# Patient Record
Sex: Female | Born: 1960 | Race: White | Hispanic: No | Smoking: Current every day smoker
Health system: Southern US, Community
[De-identification: ages and names within clinical notes are randomized; demographics above are authoritative.]

## PROBLEM LIST (undated history)

## (undated) DIAGNOSIS — Z9889 Other specified postprocedural states: Secondary | ICD-10-CM

## (undated) DIAGNOSIS — R112 Nausea with vomiting, unspecified: Secondary | ICD-10-CM

## (undated) HISTORY — PX: OTHER SURGICAL HISTORY: SHX169

## (undated) HISTORY — PX: TUBAL LIGATION: SHX77

---

## 2001-05-12 ENCOUNTER — Emergency Department (HOSPITAL_COMMUNITY): Admission: EM | Admit: 2001-05-12 | Discharge: 2001-05-13 | Payer: Self-pay

## 2001-10-18 ENCOUNTER — Encounter: Payer: Self-pay | Admitting: Emergency Medicine

## 2001-10-18 ENCOUNTER — Emergency Department (HOSPITAL_COMMUNITY): Admission: EM | Admit: 2001-10-18 | Discharge: 2001-10-18 | Payer: Self-pay | Admitting: Emergency Medicine

## 2002-01-28 ENCOUNTER — Encounter: Admission: RE | Admit: 2002-01-28 | Discharge: 2002-01-28 | Payer: Self-pay | Admitting: Family Medicine

## 2002-07-23 ENCOUNTER — Emergency Department (HOSPITAL_COMMUNITY): Admission: EM | Admit: 2002-07-23 | Discharge: 2002-07-23 | Payer: Self-pay | Admitting: Emergency Medicine

## 2002-07-23 ENCOUNTER — Encounter: Payer: Self-pay | Admitting: Emergency Medicine

## 2002-09-05 ENCOUNTER — Emergency Department (HOSPITAL_COMMUNITY): Admission: EM | Admit: 2002-09-05 | Discharge: 2002-09-05 | Payer: Self-pay | Admitting: Emergency Medicine

## 2002-09-09 ENCOUNTER — Encounter: Payer: Self-pay | Admitting: Emergency Medicine

## 2002-09-09 ENCOUNTER — Emergency Department (HOSPITAL_COMMUNITY): Admission: AD | Admit: 2002-09-09 | Discharge: 2002-09-10 | Payer: Self-pay | Admitting: Emergency Medicine

## 2002-12-07 ENCOUNTER — Emergency Department (HOSPITAL_COMMUNITY): Admission: EM | Admit: 2002-12-07 | Discharge: 2002-12-07 | Payer: Self-pay | Admitting: Emergency Medicine

## 2003-01-11 ENCOUNTER — Emergency Department (HOSPITAL_COMMUNITY): Admission: EM | Admit: 2003-01-11 | Discharge: 2003-01-11 | Payer: Self-pay | Admitting: Emergency Medicine

## 2003-01-15 ENCOUNTER — Emergency Department (HOSPITAL_COMMUNITY): Admission: EM | Admit: 2003-01-15 | Discharge: 2003-01-15 | Payer: Self-pay | Admitting: Emergency Medicine

## 2003-01-29 ENCOUNTER — Emergency Department (HOSPITAL_COMMUNITY): Admission: AD | Admit: 2003-01-29 | Discharge: 2003-01-29 | Payer: Self-pay | Admitting: Family Medicine

## 2003-02-10 ENCOUNTER — Emergency Department (HOSPITAL_COMMUNITY): Admission: AD | Admit: 2003-02-10 | Discharge: 2003-02-10 | Payer: Self-pay | Admitting: Family Medicine

## 2003-03-11 ENCOUNTER — Emergency Department (HOSPITAL_COMMUNITY): Admission: AD | Admit: 2003-03-11 | Discharge: 2003-03-11 | Payer: Self-pay

## 2003-03-30 ENCOUNTER — Emergency Department (HOSPITAL_COMMUNITY): Admission: EM | Admit: 2003-03-30 | Discharge: 2003-03-30 | Payer: Self-pay | Admitting: Internal Medicine

## 2003-04-02 ENCOUNTER — Emergency Department (HOSPITAL_COMMUNITY): Admission: EM | Admit: 2003-04-02 | Discharge: 2003-04-02 | Payer: Self-pay | Admitting: Physical Therapy

## 2003-04-29 ENCOUNTER — Encounter: Admission: RE | Admit: 2003-04-29 | Discharge: 2003-04-29 | Payer: Self-pay | Admitting: Family Medicine

## 2003-04-29 ENCOUNTER — Other Ambulatory Visit: Admission: RE | Admit: 2003-04-29 | Discharge: 2003-04-29 | Payer: Self-pay | Admitting: Family Medicine

## 2003-07-18 ENCOUNTER — Emergency Department (HOSPITAL_COMMUNITY): Admission: EM | Admit: 2003-07-18 | Discharge: 2003-07-18 | Payer: Self-pay | Admitting: Family Medicine

## 2003-09-13 ENCOUNTER — Emergency Department (HOSPITAL_COMMUNITY): Admission: EM | Admit: 2003-09-13 | Discharge: 2003-09-13 | Payer: Self-pay | Admitting: Emergency Medicine

## 2003-09-14 ENCOUNTER — Emergency Department (HOSPITAL_COMMUNITY): Admission: EM | Admit: 2003-09-14 | Discharge: 2003-09-14 | Payer: Self-pay | Admitting: Emergency Medicine

## 2003-10-02 ENCOUNTER — Encounter: Admission: RE | Admit: 2003-10-02 | Discharge: 2003-10-02 | Payer: Self-pay | Admitting: Family Medicine

## 2003-10-31 ENCOUNTER — Emergency Department (HOSPITAL_COMMUNITY): Admission: EM | Admit: 2003-10-31 | Discharge: 2003-10-31 | Payer: Self-pay | Admitting: Family Medicine

## 2004-06-01 ENCOUNTER — Emergency Department (HOSPITAL_COMMUNITY): Admission: EM | Admit: 2004-06-01 | Discharge: 2004-06-01 | Payer: Self-pay | Admitting: Emergency Medicine

## 2004-08-10 ENCOUNTER — Emergency Department (HOSPITAL_COMMUNITY): Admission: EM | Admit: 2004-08-10 | Discharge: 2004-08-10 | Payer: Self-pay | Admitting: Emergency Medicine

## 2006-02-03 ENCOUNTER — Emergency Department (HOSPITAL_COMMUNITY): Admission: EM | Admit: 2006-02-03 | Discharge: 2006-02-03 | Payer: Self-pay | Admitting: Emergency Medicine

## 2006-07-16 ENCOUNTER — Emergency Department (HOSPITAL_COMMUNITY): Admission: EM | Admit: 2006-07-16 | Discharge: 2006-07-16 | Payer: Self-pay | Admitting: Emergency Medicine

## 2006-08-06 ENCOUNTER — Emergency Department (HOSPITAL_COMMUNITY): Admission: EM | Admit: 2006-08-06 | Discharge: 2006-08-06 | Payer: Self-pay | Admitting: Emergency Medicine

## 2006-11-27 ENCOUNTER — Emergency Department (HOSPITAL_COMMUNITY): Admission: EM | Admit: 2006-11-27 | Discharge: 2006-11-27 | Payer: Self-pay | Admitting: Emergency Medicine

## 2010-11-03 ENCOUNTER — Emergency Department (HOSPITAL_COMMUNITY)
Admission: EM | Admit: 2010-11-03 | Discharge: 2010-11-03 | Disposition: A | Discharge status: Home / Self Care | Payer: Self-pay | Attending: Emergency Medicine | Admitting: Emergency Medicine

## 2010-11-03 DIAGNOSIS — E876 Hypokalemia: Secondary | ICD-10-CM | POA: Insufficient documentation

## 2010-11-03 DIAGNOSIS — R071 Chest pain on breathing: Secondary | ICD-10-CM | POA: Insufficient documentation

## 2010-11-03 DIAGNOSIS — M25569 Pain in unspecified knee: Secondary | ICD-10-CM | POA: Insufficient documentation

## 2010-11-03 LAB — CBC
HCT: 31.8 % — ABNORMAL LOW (ref 36.0–46.0)
Hemoglobin: 10.9 g/dL — ABNORMAL LOW (ref 12.0–15.0)
MCH: 28.5 pg (ref 26.0–34.0)
MCHC: 34.3 g/dL (ref 30.0–36.0)
MCV: 83 fL (ref 78.0–100.0)
Platelets: 287 10*3/uL (ref 150–400)
RBC: 3.83 MIL/uL — ABNORMAL LOW (ref 3.87–5.11)
RDW: 14.5 % (ref 11.5–15.5)
WBC: 10.2 10*3/uL (ref 4.0–10.5)

## 2010-11-03 LAB — POCT I-STAT TROPONIN I: Troponin i, poc: 0 ng/mL (ref 0.00–0.08)

## 2010-11-03 LAB — COMPREHENSIVE METABOLIC PANEL
ALT: 6 U/L (ref 0–35)
AST: 17 U/L (ref 0–37)
Albumin: 3.5 g/dL (ref 3.5–5.2)
Alkaline Phosphatase: 64 U/L (ref 39–117)
BUN: 10 mg/dL (ref 6–23)
CO2: 23 mEq/L (ref 19–32)
Calcium: 8.8 mg/dL (ref 8.4–10.5)
Chloride: 105 mEq/L (ref 96–112)
Creatinine, Ser: 0.85 mg/dL (ref 0.50–1.10)
GFR calc Af Amer: >60 mL/min (ref >60)
GFR calc non Af Amer: >60 mL/min (ref >60)
Glucose, Bld: 99 mg/dL (ref 70–99)
Potassium: 3 mEq/L — ABNORMAL LOW (ref 3.5–5.1)
Sodium: 139 mEq/L (ref 135–145)
Total Bilirubin: 0.3 mg/dL (ref 0.3–1.2)
Total Protein: 7.1 g/dL (ref 6.0–8.3)

## 2010-12-16 LAB — URINALYSIS, ROUTINE W REFLEX MICROSCOPIC
Glucose, UA: NEGATIVE
Hgb urine dipstick: NEGATIVE
Ketones, ur: 15 — AB
Nitrite: NEGATIVE
Protein, ur: NEGATIVE
Specific Gravity, Urine: 1.027
Urobilinogen, UA: 1
pH: 5.5

## 2010-12-16 LAB — COMPREHENSIVE METABOLIC PANEL
ALT: 10
AST: 27
Albumin: 3.7
Alkaline Phosphatase: 60
BUN: 13
CO2: 21
Calcium: 8.3 — ABNORMAL LOW
Chloride: 110
Creatinine, Ser: 0.78
GFR calc Af Amer: >60
GFR calc non Af Amer: >60
Glucose, Bld: 107 — ABNORMAL HIGH
Potassium: 3.2 — ABNORMAL LOW
Sodium: 137
Total Bilirubin: 1.3 — ABNORMAL HIGH
Total Protein: 6.9

## 2010-12-16 LAB — DIFFERENTIAL
Basophils Absolute: 0
Basophils Relative: 0
Eosinophils Absolute: 0.4
Eosinophils Relative: 4
Lymphocytes Relative: 11 — ABNORMAL LOW
Lymphs Abs: 0.9
Monocytes Absolute: 0.2
Monocytes Relative: 2 — ABNORMAL LOW
Neutro Abs: 7
Neutrophils Relative %: 82 — ABNORMAL HIGH

## 2010-12-16 LAB — CK TOTAL AND CKMB (NOT AT ARMC)
CK, MB: 1.5
Relative Index: INVALID
Total CK: 90

## 2010-12-16 LAB — LIPASE, BLOOD: Lipase: 32

## 2010-12-16 LAB — CBC
HCT: 33.7 — ABNORMAL LOW
Hemoglobin: 11.6 — ABNORMAL LOW
MCHC: 34.3
MCV: 84.4
Platelets: 268
RBC: 3.99
RDW: 14.1 — ABNORMAL HIGH
WBC: 8.5

## 2010-12-16 LAB — TROPONIN I: Troponin I: 0.01

## 2010-12-16 LAB — D-DIMER, QUANTITATIVE: D-Dimer, Quant: 0.4

## 2011-01-21 ENCOUNTER — Emergency Department (HOSPITAL_COMMUNITY)
Admission: EM | Admit: 2011-01-21 | Discharge: 2011-01-21 | Disposition: A | Discharge status: Home / Self Care | Payer: Self-pay | Attending: Emergency Medicine | Admitting: Emergency Medicine

## 2011-01-21 ENCOUNTER — Encounter: Payer: Self-pay | Admitting: *Deleted

## 2011-01-21 DIAGNOSIS — R10819 Abdominal tenderness, unspecified site: Secondary | ICD-10-CM | POA: Insufficient documentation

## 2011-01-21 DIAGNOSIS — R112 Nausea with vomiting, unspecified: Secondary | ICD-10-CM | POA: Insufficient documentation

## 2011-01-21 DIAGNOSIS — R509 Fever, unspecified: Secondary | ICD-10-CM | POA: Insufficient documentation

## 2011-01-21 DIAGNOSIS — J45909 Unspecified asthma, uncomplicated: Secondary | ICD-10-CM | POA: Insufficient documentation

## 2011-01-21 DIAGNOSIS — K2990 Gastroduodenitis, unspecified, without bleeding: Secondary | ICD-10-CM | POA: Insufficient documentation

## 2011-01-21 DIAGNOSIS — R350 Frequency of micturition: Secondary | ICD-10-CM | POA: Insufficient documentation

## 2011-01-21 DIAGNOSIS — R109 Unspecified abdominal pain: Secondary | ICD-10-CM | POA: Insufficient documentation

## 2011-01-21 DIAGNOSIS — K2970 Gastritis, unspecified, without bleeding: Secondary | ICD-10-CM | POA: Insufficient documentation

## 2011-01-21 DIAGNOSIS — N390 Urinary tract infection, site not specified: Secondary | ICD-10-CM | POA: Insufficient documentation

## 2011-01-21 LAB — URINALYSIS, ROUTINE W REFLEX MICROSCOPIC
Glucose, UA: NEGATIVE mg/dL
Ketones, ur: 40 mg/dL — AB
Nitrite: POSITIVE — AB
Protein, ur: NEGATIVE mg/dL
Specific Gravity, Urine: 1.028 (ref 1.005–1.030)
Urobilinogen, UA: 2 mg/dL — ABNORMAL HIGH (ref 0.0–1.0)
pH: 5.5 (ref 5.0–8.0)

## 2011-01-21 LAB — POCT PREGNANCY, URINE: Preg Test, Ur: NEGATIVE

## 2011-01-21 LAB — URINE MICROSCOPIC-ADD ON

## 2011-01-21 MED ORDER — GI COCKTAIL ~~LOC~~
30.0000 mL | Freq: Once | ORAL | Status: AC
  Administered 2011-01-21: 30 mL via ORAL
  Filled 2011-01-21: qty 30

## 2011-01-21 MED ORDER — CEPHALEXIN 250 MG PO CAPS
500.0000 mg | ORAL_CAPSULE | Freq: Once | ORAL | Status: AC
  Administered 2011-01-21: 500 mg via ORAL
  Filled 2011-01-21: qty 2

## 2011-01-21 MED ORDER — CEPHALEXIN 500 MG PO CAPS: 500.0000 mg | ORAL_CAPSULE | Freq: Four times a day (QID) | ORAL | Status: AC

## 2011-01-21 NOTE — ED Notes (Signed)
MD at bedside. 

## 2011-01-21 NOTE — ED Notes (Signed)
Lower abd pain and dark urine since Wednesday.  Some nv and diarrhea

## 2011-01-21 NOTE — ED Provider Notes (Signed)
History     CSN: [id number] Arrival date & time: 01/21/2011  4:36 PM   First MD Initiated Contact with Patient 01/21/11 2004      Chief Complaint  Patient presents with  . Abdominal Pain    (Consider location/radiation/quality/duration/timing/severity/associated sxs/prior treatment) Patient is a 50 y.o. female presenting with abdominal pain. The history is provided by the patient.  Abdominal Pain The primary symptoms of the illness include abdominal pain, fever, nausea and vomiting. The primary symptoms of the illness do not include shortness of breath, diarrhea, dysuria, vaginal discharge or vaginal bleeding. The current episode started 2 days ago. The onset of the illness was gradual.  The abdominal pain began 2 days ago. The abdominal pain is located in the suprapubic region. The abdominal pain does not radiate. The severity of the abdominal pain is 6/10. The abdominal pain is relieved by nothing.  The fever began yesterday. The maximum temperature recorded prior to her arrival was unknown (subjective).  Vomiting occurs 2 to 5 times per day. The emesis contains stomach contents.  The patient states that she believes she is currently not pregnant. The patient has not had a change in bowel habit. Additional symptoms associated with the illness include frequency. Symptoms associated with the illness do not include chills, urgency, hematuria or back pain.    Past Medical History  Diagnosis Date  . Asthma     History reviewed. No pertinent past surgical history.  History reviewed. No pertinent family history.  History  Substance Use Topics  . Smoking status: Never Smoker   . Smokeless tobacco: Not on file  . Alcohol Use: No    OB History    Grav Para Term Preterm Abortions TAB SAB Ect Mult Living                  Review of Systems  Constitutional: Positive for fever. Negative for chills.  Respiratory: Negative for cough and shortness of breath.   Cardiovascular:  Negative for chest pain and palpitations.  Gastrointestinal: Positive for nausea, vomiting and abdominal pain. Negative for diarrhea.  Genitourinary: Positive for frequency. Negative for dysuria, urgency, hematuria, vaginal bleeding and vaginal discharge.       Abnormal odor recently  Musculoskeletal: Negative for back pain.  Neurological: Negative for light-headedness and headaches.  All other systems reviewed and are negative.    Allergies  Review of patient's allergies indicates no known allergies.  Home Medications   Current Outpatient Rx  Name Route Sig Dispense Refill  . ACETAMINOPHEN 500 MG PO TABS Oral Take 500-1,000 mg by mouth every 6 (six) hours as needed. For headache/pain       BP 107/72  Pulse 77  Temp(Src) 97.5 F (36.4 C) (Oral)  Resp 22  SpO2 100%  LMP 12/21/2010  Physical Exam  Nursing note and vitals reviewed. Constitutional: She is oriented to person, place, and time. She appears well-developed and well-nourished.  HENT:  Head: Normocephalic and atraumatic.  Eyes: Pupils are equal, round, and reactive to light.  Cardiovascular: Normal rate and regular rhythm.   Pulmonary/Chest: Effort normal and breath sounds normal. No respiratory distress.  Abdominal: Soft. Bowel sounds are normal. She exhibits no distension. There is tenderness (mild suprapubic, epigastric).  Neurological: She is alert and oriented to person, place, and time.  Skin: Skin is warm and dry.  Psychiatric: She has a normal mood and affect.    ED Course  Procedures (including critical care time)  Labs Reviewed  URINALYSIS, ROUTINE W REFLEX  MICROSCOPIC - Abnormal; Notable for the following:    Color, Urine AMBER (*) BIOCHEMICALS MAY BE AFFECTED BY COLOR   Appearance TURBID (*)    Hgb urine dipstick SMALL (*)    Bilirubin Urine MODERATE (*)    Ketones, ur 40 (*)    Urobilinogen, UA 2.0 (*)    Nitrite POSITIVE (*)    Leukocytes, UA LARGE (*)    All other components within normal  limits  URINE MICROSCOPIC-ADD ON - Abnormal; Notable for the following:    Squamous Epithelial / LPF MANY (*)    Bacteria, UA MANY (*)    All other components within normal limits  POCT PREGNANCY, URINE  POCT PREGNANCY, URINE   No results found.   1. UTI (urinary tract infection)   2. Gastritis       MDM  50 yo F presents with 2 days suprapubic abd pain, with associated N/V. Endorses mild frequency, foul odor earlier this week, subjective fever yesterday, but denies dysuria, back pain, other urinary or GU complaints. Says has thrown up ~6-7 times, and that as of ~1 hour ago, began having mild epigastric burning. Exam remarkable for mild suprapubic ttp, as well as epigastric ttp, no other ttp, rebound/guarding. Biggest concern is for urinary related etiology of sxs; UA obtained shows signs of UTI. Will treat with first dose atbx here, as well as for epigastric burning. Discussed with pt expectation of improvement, need to take medication, indications for return, and pt expresses understanding.         Theotis Burrow, MD 01/22/11 [phone]

## 2011-01-22 NOTE — ED Provider Notes (Signed)
I saw and evaluated the patient, reviewed the resident's note and I agree with the findings and plan.  Nicholes Stairs, MD 01/22/11 [phone]

## 2011-03-08 ENCOUNTER — Inpatient Hospital Stay (HOSPITAL_COMMUNITY)
Admission: EM | Admit: 2011-03-08 | Discharge: 2011-03-11 | DRG: 418 | Disposition: A | Discharge status: Home / Self Care | Payer: Self-pay | Attending: General Surgery | Admitting: General Surgery

## 2011-03-08 ENCOUNTER — Encounter (HOSPITAL_COMMUNITY): Payer: Self-pay | Admitting: Emergency Medicine

## 2011-03-08 DIAGNOSIS — K810 Acute cholecystitis: Principal | ICD-10-CM | POA: Diagnosis present

## 2011-03-08 DIAGNOSIS — R109 Unspecified abdominal pain: Secondary | ICD-10-CM | POA: Diagnosis present

## 2011-03-08 DIAGNOSIS — N390 Urinary tract infection, site not specified: Secondary | ICD-10-CM | POA: Diagnosis present

## 2011-03-08 DIAGNOSIS — K819 Cholecystitis, unspecified: Secondary | ICD-10-CM | POA: Diagnosis present

## 2011-03-08 DIAGNOSIS — F17200 Nicotine dependence, unspecified, uncomplicated: Secondary | ICD-10-CM | POA: Diagnosis present

## 2011-03-08 DIAGNOSIS — R112 Nausea with vomiting, unspecified: Secondary | ICD-10-CM | POA: Diagnosis present

## 2011-03-08 DIAGNOSIS — J45909 Unspecified asthma, uncomplicated: Secondary | ICD-10-CM | POA: Diagnosis present

## 2011-03-08 DIAGNOSIS — K859 Acute pancreatitis without necrosis or infection, unspecified: Secondary | ICD-10-CM

## 2011-03-08 DIAGNOSIS — D72829 Elevated white blood cell count, unspecified: Secondary | ICD-10-CM | POA: Diagnosis present

## 2011-03-08 DIAGNOSIS — E669 Obesity, unspecified: Secondary | ICD-10-CM | POA: Diagnosis present

## 2011-03-08 HISTORY — DX: Nausea with vomiting, unspecified: Z98.890

## 2011-03-08 HISTORY — DX: Nausea with vomiting, unspecified: R11.2

## 2011-03-08 LAB — DIFFERENTIAL
Basophils Absolute: 0.1 10*3/uL (ref 0.0–0.1)
Basophils Relative: 0 % (ref 0–1)
Eosinophils Absolute: 0.4 10*3/uL (ref 0.0–0.7)
Eosinophils Relative: 2 % (ref 0–5)
Lymphocytes Relative: 17 % (ref 12–46)
Lymphs Abs: 2.8 10*3/uL (ref 0.7–4.0)
Monocytes Absolute: 0.7 10*3/uL (ref 0.1–1.0)
Monocytes Relative: 4 % (ref 3–12)
Neutro Abs: 12.9 10*3/uL — ABNORMAL HIGH (ref 1.7–7.7)
Neutrophils Relative %: 77 % (ref 43–77)

## 2011-03-08 LAB — CBC
HCT: 33.7 % — ABNORMAL LOW (ref 36.0–46.0)
Hemoglobin: 11.3 g/dL — ABNORMAL LOW (ref 12.0–15.0)
MCH: 28 pg (ref 26.0–34.0)
MCHC: 33.5 g/dL (ref 30.0–36.0)
MCV: 83.6 fL (ref 78.0–100.0)
Platelets: 407 10*3/uL — ABNORMAL HIGH (ref 150–400)
RBC: 4.03 MIL/uL (ref 3.87–5.11)
RDW: 13.4 % (ref 11.5–15.5)
WBC: 16.9 10*3/uL — ABNORMAL HIGH (ref 4.0–10.5)

## 2011-03-08 LAB — COMPREHENSIVE METABOLIC PANEL
ALT: 19 U/L (ref 0–35)
AST: 48 U/L — ABNORMAL HIGH (ref 0–37)
Albumin: 4 g/dL (ref 3.5–5.2)
Alkaline Phosphatase: 113 U/L (ref 39–117)
BUN: 14 mg/dL (ref 6–23)
CO2: 21 mEq/L (ref 19–32)
Calcium: 9.1 mg/dL (ref 8.4–10.5)
Chloride: 103 mEq/L (ref 96–112)
Creatinine, Ser: 0.9 mg/dL (ref 0.50–1.10)
GFR calc Af Amer: 86 mL/min — ABNORMAL LOW (ref >90)
GFR calc non Af Amer: 74 mL/min — ABNORMAL LOW (ref >90)
Glucose, Bld: 115 mg/dL — ABNORMAL HIGH (ref 70–99)
Potassium: 3.6 mEq/L (ref 3.5–5.1)
Sodium: 138 mEq/L (ref 135–145)
Total Bilirubin: 0.5 mg/dL (ref 0.3–1.2)
Total Protein: 7.8 g/dL (ref 6.0–8.3)

## 2011-03-08 NOTE — ED Notes (Signed)
PT. REPORTS VOMITTING WITH RIGHT ABDOMINAL PAIN ONSET THIS EVENING , DENIES DIARRHEA OR FEVER .

## 2011-03-09 ENCOUNTER — Emergency Department (HOSPITAL_COMMUNITY): Payer: Self-pay

## 2011-03-09 ENCOUNTER — Inpatient Hospital Stay (HOSPITAL_COMMUNITY): Payer: Self-pay

## 2011-03-09 ENCOUNTER — Encounter (HOSPITAL_COMMUNITY): Payer: Self-pay | Admitting: *Deleted

## 2011-03-09 DIAGNOSIS — R109 Unspecified abdominal pain: Secondary | ICD-10-CM | POA: Diagnosis present

## 2011-03-09 DIAGNOSIS — D72829 Elevated white blood cell count, unspecified: Secondary | ICD-10-CM | POA: Diagnosis present

## 2011-03-09 DIAGNOSIS — J45909 Unspecified asthma, uncomplicated: Secondary | ICD-10-CM | POA: Diagnosis present

## 2011-03-09 DIAGNOSIS — K8010 Calculus of gallbladder with chronic cholecystitis without obstruction: Secondary | ICD-10-CM

## 2011-03-09 DIAGNOSIS — E669 Obesity, unspecified: Secondary | ICD-10-CM | POA: Diagnosis present

## 2011-03-09 DIAGNOSIS — K819 Cholecystitis, unspecified: Secondary | ICD-10-CM | POA: Diagnosis present

## 2011-03-09 LAB — URINALYSIS, ROUTINE W REFLEX MICROSCOPIC
Glucose, UA: NEGATIVE mg/dL
Hgb urine dipstick: NEGATIVE
Ketones, ur: 15 mg/dL — AB
Nitrite: NEGATIVE
Protein, ur: NEGATIVE mg/dL
Specific Gravity, Urine: 1.024 (ref 1.005–1.030)
Urobilinogen, UA: 1 mg/dL (ref 0.0–1.0)
pH: 5.5 (ref 5.0–8.0)

## 2011-03-09 LAB — URINE MICROSCOPIC-ADD ON

## 2011-03-09 LAB — URINE CULTURE
Colony Count: NO GROWTH
Culture  Setup Time: 201301090700
Culture: NO GROWTH

## 2011-03-09 LAB — LIPASE, BLOOD: Lipase: 36 U/L (ref 11–59)

## 2011-03-09 MED ORDER — LORAZEPAM 2 MG/ML IJ SOLN
0.5000 mg | Freq: Once | INTRAMUSCULAR | Status: AC
  Administered 2011-03-09: 0.5 mg via INTRAVENOUS
  Filled 2011-03-09: qty 1

## 2011-03-09 MED ORDER — DEXTROSE 5 % IV SOLN
1.0000 g | INTRAVENOUS | Status: DC
  Administered 2011-03-09 – 2011-03-10 (×2): 1 g via INTRAVENOUS
  Filled 2011-03-09 (×2): qty 10

## 2011-03-09 MED ORDER — MORPHINE SULFATE 2 MG/ML IJ SOLN
2.0000 mg | INTRAMUSCULAR | Status: DC | PRN
  Administered 2011-03-09 (×2): 2 mg via INTRAVENOUS
  Filled 2011-03-09 (×2): qty 1

## 2011-03-09 MED ORDER — PROMETHAZINE HCL 25 MG PO TABS: 12.5000 mg | ORAL_TABLET | Freq: Four times a day (QID) | ORAL | Status: DC | PRN

## 2011-03-09 MED ORDER — PROMETHAZINE HCL 25 MG/ML IJ SOLN: 12.5000 mg | Freq: Four times a day (QID) | INTRAMUSCULAR | Status: DC | PRN

## 2011-03-09 MED ORDER — IOHEXOL 300 MG/ML  SOLN
100.0000 mL | Freq: Once | INTRAMUSCULAR | Status: AC | PRN
  Administered 2011-03-09: 100 mL via INTRAVENOUS

## 2011-03-09 MED ORDER — ONDANSETRON HCL 4 MG/2ML IJ SOLN
4.0000 mg | Freq: Once | INTRAMUSCULAR | Status: AC
  Administered 2011-03-09: 4 mg via INTRAVENOUS
  Filled 2011-03-09: qty 2

## 2011-03-09 MED ORDER — ALUM & MAG HYDROXIDE-SIMETH 200-200-20 MG/5ML PO SUSP: 30.0000 mL | Freq: Four times a day (QID) | ORAL | Status: DC | PRN

## 2011-03-09 MED ORDER — IOHEXOL 300 MG/ML  SOLN: 20.0000 mL | INTRAMUSCULAR | Status: DC

## 2011-03-09 MED ORDER — DEXTROSE 5 % IV SOLN
1.0000 g | Freq: Once | INTRAVENOUS | Status: AC
  Administered 2011-03-09: 1 g via INTRAVENOUS
  Filled 2011-03-09: qty 10

## 2011-03-09 MED ORDER — MORPHINE SULFATE 4 MG/ML IJ SOLN
4.0000 mg | Freq: Once | INTRAMUSCULAR | Status: AC
  Administered 2011-03-09: 4 mg via INTRAVENOUS
  Filled 2011-03-09: qty 1

## 2011-03-09 MED ORDER — ONDANSETRON HCL 4 MG/2ML IJ SOLN
INTRAMUSCULAR | Status: AC
  Administered 2011-03-09: 02:00:00
  Filled 2011-03-09: qty 2

## 2011-03-09 NOTE — ED Provider Notes (Signed)
Medical screening examination/treatment/procedure(s) were conducted as a shared visit with non-physician practitioner(s) and myself.  I personally evaluated the patient during the encounter  CT scan with evidence of acute pancreatitis and pancreatic phlegmon however her lipase is normal.  The patient's symptoms and her continued nausea and vomiting will omit the patient for further evaluation  Lyanne Co, MD 03/09/11 [phone]

## 2011-03-09 NOTE — H&P (Signed)
Angelica Frank is an 51 y.o. female.   CC- Abdominal pain and N/V x 2 days HPI-Had real bad pains in her r side and started throwing up.  This all started about 03/07/11.  Got worse 03/08/11 evening.  Started as real bad pain on the R side.  The pain was sharp.  Was coming and going type initally and then became constant.  At it's worst it was a 9/10.  Morphine seemed to have relieved it.  Has had this pain last month which was almost like this and thoguht to have a UTI.   Nauseaous when she eats and drinks.  Has a h/o cholelithiasis-had an USg of the liver which showed didn't GB stones maybe 10-15 yrs ago.Usually does get UTI's but nothing like last month's or this one's. No ithcing of skin or yellowing of eyes.   Laying down caused more pain and stooping over relieved the pain.  NO dark stool or tarry stool.  Monday night had watery diarrhea x 2 events.  No burning in throught, no diffculty swallowing.  No PMH of food-vomit relationship.  Denies Dysuria, no frequency-lost control of bladder with vomit, which is not new.  Pain in the suprapubic area and then went up to the mid-abdomen.   Doesn;t report any fever or chills.  Threw up maybe about 2-3 x at home and twice here.   No assosc CP, no SOB, no blurred or double vision.  When she gets the pain she vomits after.     Past Medical History  Diagnosis Date  . Asthma     diagnosed in her teens-no meds  . PONV (postoperative nausea and vomiting)     Past Surgical History  Procedure Date  . Tuibal ligation     Had BTL  21 yes ago  . Tubal ligation     Family History  Problem Relation Age of Onset  . Arthritis Mother   . Coronary artery disease Mother   . Hypertension Father   . Diabetes Father   . Coronary artery disease Father   . Arrhythmia Maternal Grandmother   . Cancer Maternal Grandfather    Social History:  reports that she has been smoking Cigarettes.  She has been smoking about .25 packs per day. She does not have any smokeless  tobacco history on file. She reports that she drinks alcohol. She reports that she does not use illicit drugs.  Allergies: No Known Allergies  Medications Prior to Admission  Medication Dose Route Frequency Provider Last Rate Last Dose  . alum & mag hydroxide-simeth (MAALOX/MYLANTA) 200-200-20 MG/5ML suspension 30 mL  30 mL Oral Q6H PRN Pleas Koch, MD      . cefTRIAXone (ROCEPHIN) 1 g in dextrose 5 % 50 mL IVPB  1 g Intravenous Once Arman Filter, NP   1 g at 03/09/11 0641  . cefTRIAXone (ROCEPHIN) 1 g in dextrose 5 % 50 mL IVPB  1 g Intravenous Q24H Pleas Koch, MD      . iohexol (OMNIPAQUE) 300 MG/ML solution 100 mL  100 mL Intravenous Once PRN Medication Radiologist   100 mL at 03/09/11 0455  . LORazepam (ATIVAN) injection 0.5 mg  0.5 mg Intravenous Once Arman Filter, NP   0.5 mg at 03/09/11 0419  . LORazepam (ATIVAN) injection 0.5 mg  0.5 mg Intravenous Once Pleas Koch, MD      . morphine 2 MG/ML injection 2 mg  2 mg Intravenous Q4H PRN Pleas Koch, MD      .  morphine 4 MG/ML injection 4 mg  4 mg Intravenous Once Arman Filter, NP   4 mg at 03/09/11 0406  . ondansetron (ZOFRAN) 4 MG/2ML injection           . ondansetron (ZOFRAN) injection 4 mg  4 mg Intravenous Once Arman Filter, NP   4 mg at 03/09/11 0404  . promethazine (PHENERGAN) tablet 12.5 mg  12.5 mg Oral Q6H PRN Pleas Koch, MD       Or  . promethazine (PHENERGAN) injection 12.5 mg  12.5 mg Intravenous Q6H PRN Pleas Koch, MD      . DISCONTD: iohexol (OMNIPAQUE) 300 MG/ML solution 20 mL  20 mL Intravenous Q1 Hr x 2 Medication Radiologist       Medications Prior to Admission  Medication Sig Dispense Refill  . acetaminophen (TYLENOL) 500 MG tablet Take 500-1,000 mg by mouth every 6 (six) hours as needed. For headache/pain         Results for orders placed during the hospital encounter of 03/08/11 (from the past 48 hour(s))  CBC     Status: Abnormal   Collection Time   03/08/11 10:15 PM      Component Value Range Comment    WBC 16.9 (*) 4.0 - 10.5 (K/uL)    RBC 4.03  3.87 - 5.11 (MIL/uL)    Hemoglobin 11.3 (*) 12.0 - 15.0 (g/dL)    HCT 62.1 (*) 30.8 - 46.0 (%)    MCV 83.6  78.0 - 100.0 (fL)    MCH 28.0  26.0 - 34.0 (pg)    MCHC 33.5  30.0 - 36.0 (g/dL)    RDW 65.7  84.6 - 96.2 (%)    Platelets 407 (*) 150 - 400 (K/uL)   DIFFERENTIAL     Status: Abnormal   Collection Time   03/08/11 10:15 PM      Component Value Range Comment   Neutrophils Relative 77  43 - 77 (%)    Neutro Abs 12.9 (*) 1.7 - 7.7 (K/uL)    Lymphocytes Relative 17  12 - 46 (%)    Lymphs Abs 2.8  0.7 - 4.0 (K/uL)    Monocytes Relative 4  3 - 12 (%)    Monocytes Absolute 0.7  0.1 - 1.0 (K/uL)    Eosinophils Relative 2  0 - 5 (%)    Eosinophils Absolute 0.4  0.0 - 0.7 (K/uL)    Basophils Relative 0  0 - 1 (%)    Basophils Absolute 0.1  0.0 - 0.1 (K/uL)   COMPREHENSIVE METABOLIC PANEL     Status: Abnormal   Collection Time   03/08/11 10:15 PM      Component Value Range Comment   Sodium 138  135 - 145 (mEq/L)    Potassium 3.6  3.5 - 5.1 (mEq/L)    Chloride 103  96 - 112 (mEq/L)    CO2 21  19 - 32 (mEq/L)    Glucose, Bld 115 (*) 70 - 99 (mg/dL)    BUN 14  6 - 23 (mg/dL)    Creatinine, Ser 9.52  0.50 - 1.10 (mg/dL)    Calcium 9.1  8.4 - 10.5 (mg/dL)    Total Protein 7.8  6.0 - 8.3 (g/dL)    Albumin 4.0  3.5 - 5.2 (g/dL)    AST 48 (*) 0 - 37 (U/L)    ALT 19  0 - 35 (U/L)    Alkaline Phosphatase 113  39 - 117 (U/L)    Total  Bilirubin 0.5  0.3 - 1.2 (mg/dL)    GFR calc non Af Amer 74 (*) >90 (mL/min)    GFR calc Af Amer 86 (*) >90 (mL/min)   LIPASE, BLOOD     Status: Normal   Collection Time   03/08/11 10:15 PM      Component Value Range Comment   Lipase 36  11 - 59 (U/L)   URINALYSIS, ROUTINE W REFLEX MICROSCOPIC     Status: Abnormal   Collection Time   03/09/11  1:28 AM      Component Value Range Comment   Color, Urine YELLOW  YELLOW     APPearance CLOUDY (*) CLEAR     Specific Gravity, Urine 1.024  1.005 - 1.030     pH 5.5  5.0 -  8.0     Glucose, UA NEGATIVE  NEGATIVE (mg/dL)    Hgb urine dipstick NEGATIVE  NEGATIVE     Bilirubin Urine SMALL (*) NEGATIVE     Ketones, ur 15 (*) NEGATIVE (mg/dL)    Protein, ur NEGATIVE  NEGATIVE (mg/dL)    Urobilinogen, UA 1.0  0.0 - 1.0 (mg/dL)    Nitrite NEGATIVE  NEGATIVE     Leukocytes, UA MODERATE (*) NEGATIVE    URINE MICROSCOPIC-ADD ON     Status: Abnormal   Collection Time   03/09/11  1:28 AM      Component Value Range Comment   Squamous Epithelial / LPF FEW (*) RARE     WBC, UA 21-50  <3 (WBC/hpf)    Bacteria, UA RARE  RARE     Ct Abdomen Pelvis W Contrast  03/09/2011  *RADIOLOGY REPORT*  Clinical Data: Lower abdominal pain.  Pain for 2-3 days.  CT ABDOMEN AND PELVIS WITH CONTRAST  Technique:  Multidetector CT imaging of the abdomen and pelvis was performed following the standard protocol during bolus administration of intravenous contrast.  Contrast: OMNIPAQUE IOHEXOL 300 MG/ML IV SOLN  Comparison: None.  Findings: Dependent atelectasis at the lung bases.  Visualized heart is grossly normal.  Small hiatal hernia.  Mild intrahepatic biliary ductal dilation is present.  No calcified gallstones are identified.  There may be mild pericholecystic fluid or wall thickening.  Peripancreatic phlegmon is present without focal fluid collections.  Fluid tracks along the posterior proximal fundus of the stomach.  The gallbladder fluid or wall thickening may be associated with pancreatitis.  Portal vein remains patent.  Splenic vein normal.  No vascular complications of pancreatitis.  The common bile duct is within normal limits for diameter. Inflammation of the duodenum and adjacent small bowel associated with pancreatitis.  No pancreatic necrosis.  The adrenal glands appear normal.  Normal renal enhancement and delayed excretion of contrast.  Mild atherosclerosis. Colon appears within normal limits.  Normal appendix in the right lower quadrant.  Urinary bladder is normal.  Both ureters are  normal.  No adenopathy.  No aggressive osseous lesions.  IMPRESSION: 1.  Acute pancreatitis.  No focal fluid collections/pseudocyst identified. 2.  Mild gallbladder wall thickening.  This may represent adjacent inflammatory change from pancreatitis.  No calcified gallstones are identified.  Original Report Authenticated By: Andreas Newport, M.D.    Review of Systems  All other systems reviewed and are negative.  Exceprt as per HPI  Blood pressure 111/60, pulse 62, temperature 98.5 F (36.9 C), temperature source Oral, resp. rate 20, height 5\' 8"  (1.727 m), weight 94.711 kg (208 lb 12.8 oz), last menstrual period 02/10/2011, SpO2 98.00%. Physical Exam  Constitutional:  She is oriented to person, place, and time. She appears well-developed and well-nourished.  HENT:  Head: Normocephalic and atraumatic.       Poor dentition NO thyromegaly Neck supple and soft.  Eyes: Conjunctivae are normal. Pupils are equal, round, and reactive to light. Right eye exhibits no discharge. Left eye exhibits no discharge. No scleral icterus.  Neck: Normal range of motion. Neck supple. No JVD present. No tracheal deviation present. No thyromegaly present.  Cardiovascular: Normal rate, regular rhythm and normal heart sounds.  Exam reveals no gallop and no friction rub.   No murmur heard. Respiratory: Effort normal and breath sounds normal. No stridor. No respiratory distress. She has no wheezes. She has no rales. She exhibits no tenderness.  GI: Bowel sounds are normal.       Slightly tender R upper quadrant Murphy's sign mild +-no specific Hepatomegaly BS normal Abdomen slightly distended with good BS. No rebound, no gaurding  Genitourinary:       deferred  Musculoskeletal: She exhibits no edema and no tenderness.  Lymphadenopathy:    She has no cervical adenopathy.  Neurological: She is alert and oriented to person, place, and time.  Skin: Skin is warm and dry.  Psychiatric: She has a normal mood and  affect. Her behavior is normal. Judgment and thought content normal.     Assessment/Plan Principal Problem:  *Abdominal pain-Ddx Pancreatitis vs Chlocystits vs UTI Will cover for UTI Will follow Urine cultures-USG of the kdneys as well, although no CVA tenderness Considering Murphy's sign mildy +, will get US abdomen and pelvis to determine if there are any GB staones-as her lipase is normal, consideration should be given to a passed GB stone.  Given she has poor exposure to Healthcare, we will get a lipid panel, as Hyper triglyceride also can cause this Will keep on PO clears and round the clock morphine and reassess  Active Problems:  Asthma-not on meds-stable at present   Obesity (BMI 30-39.9)-needs PCP to discuss this   Leucocytosis-2/2 to Possible UTI-follow cultures  Tob use-Pre-contemplative  >45 min  Briannon Boggio,JAI 03/09/2011, 10:14 AM

## 2011-03-09 NOTE — Consult Note (Signed)
Pt seen and examined.  She c/o ruq tenderness which she has on exam, u/s consistent with gallbladder disease, elevated wbc all c/w cholecystitis.  We discussed lap chole tomorrow.   I discussed the procedure in detail.    We discussed the risks and benefits of a laparoscopic cholecystectomy and possible cholangiogram including, but not limited to bleeding, infection, injury to surrounding structures such as the intestine or liver, bile leak, retained gallstones, need to convert to an open procedure, prolonged diarrhea, blood clots such as  DVT, common bile duct injury, anesthesia risks, and possible need for additional procedures.  The likelihood of improvement in symptoms and return to the patient's normal status is good. We discussed the typical post-operative recovery course.

## 2011-03-09 NOTE — ED Provider Notes (Signed)
History     CSN: [id number]  Arrival date & time 03/08/11  2201   None     Chief Complaint  Patient presents with  . Emesis    (Consider location/radiation/quality/duration/timing/severity/associated sxs/prior treatment) HPI Comments: Patient developed acute onset of vomiting with right lower quadrant pain and cramping about 2 hours ago  Patient is a 51 y.o. female presenting with vomiting. The history is provided by the patient.  Emesis  This is a new problem. The current episode started 1 to 2 hours ago. The problem has been gradually worsening. The emesis has an appearance of stomach contents. There has been no fever. Associated symptoms include abdominal pain and diarrhea. Pertinent negatives include no chills and no fever.    Past Medical History  Diagnosis Date  . Asthma     Past Surgical History  Procedure Date  . Tuibal ligation     No family history on file.  History  Substance Use Topics  . Smoking status: Never Smoker   . Smokeless tobacco: Not on file  . Alcohol Use: No    OB History    Grav Para Term Preterm Abortions TAB SAB Ect Mult Living                  Review of Systems  Constitutional: Negative for fever and chills.  HENT: Negative for rhinorrhea.   Eyes: Negative.   Respiratory: Negative for shortness of breath.   Gastrointestinal: Positive for vomiting, abdominal pain and diarrhea. Negative for abdominal distention.  Genitourinary: Negative for dysuria.  Musculoskeletal: Negative for back pain.  Skin: Negative.   Neurological: Negative.     Allergies  Review of patient's allergies indicates no known allergies.  Home Medications   Current Outpatient Rx  Name Route Sig Dispense Refill  . ACETAMINOPHEN 500 MG PO TABS Oral Take 500-1,000 mg by mouth every 6 (six) hours as needed. For headache/pain       BP 106/67  Pulse 74  Temp(Src) 97.8 F (36.6 C) (Oral)  Resp 16  SpO2 100%  LMP 02/10/2011  Physical Exam    Constitutional: She is oriented to person, place, and time. She appears well-developed and well-nourished.  Neck: Normal range of motion.  Cardiovascular: Normal rate.   Pulmonary/Chest: Effort normal.  Abdominal: Soft. She exhibits no distension. There is tenderness.       Right lower quadrant tenderness  Musculoskeletal: Normal range of motion.  Neurological: She is alert and oriented to person, place, and time.  Skin: Skin is warm and dry. There is pallor.    ED Course  Procedures (including critical care time)  Labs Reviewed  CBC - Abnormal; Notable for the following:    WBC 16.9 (*)    Hemoglobin 11.3 (*)    HCT 33.7 (*)    Platelets 407 (*)    All other components within normal limits  DIFFERENTIAL - Abnormal; Notable for the following:    Neutro Abs 12.9 (*)    All other components within normal limits  COMPREHENSIVE METABOLIC PANEL - Abnormal; Notable for the following:    Glucose, Bld 115 (*)    AST 48 (*)    GFR calc non Af Amer 74 (*)    GFR calc Af Amer 86 (*)    All other components within normal limits  URINALYSIS, ROUTINE W REFLEX MICROSCOPIC - Abnormal; Notable for the following:    APPearance CLOUDY (*)    Bilirubin Urine SMALL (*)    Ketones, ur 15 (*)  Leukocytes, UA MODERATE (*)    All other components within normal limits  URINE MICROSCOPIC-ADD ON - Abnormal; Notable for the following:    Squamous Epithelial / LPF FEW (*)    All other components within normal limits  LIPASE, BLOOD  URINALYSIS, ROUTINE W REFLEX MICROSCOPIC  POCT PREGNANCY, URINE  URINE CULTURE   Ct Abdomen Pelvis W Contrast  03/09/2011  *RADIOLOGY REPORT*  Clinical Data: Lower abdominal pain.  Pain for 2-3 days.  CT ABDOMEN AND PELVIS WITH CONTRAST  Technique:  Multidetector CT imaging of the abdomen and pelvis was performed following the standard protocol during bolus administration of intravenous contrast.  Contrast: OMNIPAQUE IOHEXOL 300 MG/ML IV SOLN  Comparison: None.   Findings: Dependent atelectasis at the lung bases.  Visualized heart is grossly normal.  Small hiatal hernia.  Mild intrahepatic biliary ductal dilation is present.  No calcified gallstones are identified.  There may be mild pericholecystic fluid or wall thickening.  Peripancreatic phlegmon is present without focal fluid collections.  Fluid tracks along the posterior proximal fundus of the stomach.  The gallbladder fluid or wall thickening may be associated with pancreatitis.  Portal vein remains patent.  Splenic vein normal.  No vascular complications of pancreatitis.  The common bile duct is within normal limits for diameter. Inflammation of the duodenum and adjacent small bowel associated with pancreatitis.  No pancreatic necrosis.  The adrenal glands appear normal.  Normal renal enhancement and delayed excretion of contrast.  Mild atherosclerosis. Colon appears within normal limits.  Normal appendix in the right lower quadrant.  Urinary bladder is normal.  Both ureters are normal.  No adenopathy.  No aggressive osseous lesions.  IMPRESSION: 1.  Acute pancreatitis.  No focal fluid collections/pseudocyst identified. 2.  Mild gallbladder wall thickening.  This may represent adjacent inflammatory change from pancreatitis.  No calcified gallstones are identified.  Original Report Authenticated By: Andreas Newport, M.D.     1. Pancreatitis, acute   2. Lower urinary tract infection     Discussed CT findings with patient and she again verifies that she does not drink alcohol on regular basis.  She is not diabetic.  She has never had pancreatitis before.  She has no risk factors to have developed pancreatitis.  Will admit patient for pain control  and monitoring  MDM  After review of labs with elevated WBC of 16,000.  Will order CT scan to assess for appendicitis        Arman Filter, NP 03/09/11 0220  Arman Filter, NP 03/09/11 0547  Arman Filter, NP 03/09/11 [phone]

## 2011-03-09 NOTE — Progress Notes (Signed)
Notified Dr. Mahala Menghini of [place] result.

## 2011-03-09 NOTE — Consult Note (Signed)
Reason for Consult:Cholecystitis Consulting Surgeon: Dwain Sarna Referring Physician: Mahala Menghini   HPI: Angelica Frank is an 51 y.o. female who presented with 2 days of (R)sided pain. SHe has history of UTIs with some side pain at times. She's also had some N/V associated as well. NO known history of gallbladder disease. Came to ER and CT scan showed evidence of pancreatitis, also found to have UTI. Admitted to medicine service and started on abx. [place] also ordered and [place] now finds gallstones and evidence of cholecystitis, but pancreatic changes not seen. She is feeling some better at this point. Surgery consult requested.  Past Medical History:  Past Medical History  Diagnosis Date  . Asthma     diagnosed in her teens-no meds  . PONV (postoperative nausea and vomiting)     Surgical History:  Past Surgical History  Procedure Date  . Laparoscopic Tuibal ligation     Had BTL  21 yes ago        Family History:  Family History  Problem Relation Age of Onset  . Arthritis Mother   . Coronary artery disease Mother   . Hypertension Father   . Diabetes Father   . Coronary artery disease Father   . Arrhythmia Maternal Grandmother   . Cancer Maternal Grandfather     Social History:  reports that she has been smoking Cigarettes.  She has been smoking about .25 packs per day. She does not have any smokeless tobacco history on file. She reports that she drinks alcohol. She reports that she does not use illicit drugs.  Allergies: No Known Allergies  Medications: I have reviewed the patient's current medications.  ROS: See HPI for pertinent findings, otherwise complete 10 system review negative.  Physical Exam: Blood pressure 99/43, pulse 69, temperature 98.3 F (36.8 C), temperature source Oral, resp. rate 18, height 5\' 8"  (1.727 m), weight 94.711 kg (208 lb 12.8 oz), last menstrual period 02/10/2011, SpO2 99.00%.  General Appearance:  Alert, cooperative, no distress, appears stated age.  NAD  Head:  Normocephalic, without obvious abnormality, atraumatic  ENT: Unremarkable  Neck: Supple, symmetrical, trachea midline, no adenopathy, thyroid: not enlarged, symmetric, no tenderness/mass/nodules  Lungs:   Clear to auscultation bilaterally, no w/r/r, respirations unlabored without use of accessory muscles.  Chest Wall:  No tenderness or deformity  Heart:  Regular rate and rhythm, S1, S2 normal, no murmur, rub or gallop. Carotids 2+ without bruit.  Abdomen:   Soft. Small lap scar at umbilicus. Mildly tender RUQ and to a lesser extent, the epigastrum, no mass effect.  Genitalia:  Normal. No hernias  Rectal:  Deferred.  Extremities: Extremities normal, atraumatic, no cyanosis or edema  Pulses: 2+ and symmetric  Skin: Skin color, texture, turgor normal, no rashes or lesions  Neurologic: Normal affect, no gross deficits.     Labs: CBC  Basename 03/08/11 2215  WBC 16.9*  HGB 11.3*  HCT 33.7*  PLT 407*   MET  Basename 03/08/11 2215  NA 138  K 3.6  CL 103  CO2 21  GLUCOSE 115*  BUN 14  CREATININE 0.90  CALCIUM 9.1    Basename 03/08/11 2215  PROT 7.8  ALBUMIN 4.0  AST 48*  ALT 19  ALKPHOS 113  BILITOT 0.5  BILIDIR --  IBILI --  LIPASE 36   PT/INR No results found for this basename: LABPROT:2,INR:2 in the last 72 hours ABG No results found for this basename: PHART:2,PCO2:2,PO2:2,HCO3:2 in the last 72 hours    US Abdomen Complete  03/09/2011  *RADIOLOGY REPORT*  Clinical Data:  Pancreatitis.  Abdominal pain, nausea and vomiting.  COMPLETE ABDOMINAL ULTRASOUND  Comparison:  CT abdomen and pelvis 03/09/2011.  Findings:  Gallbladder:  A few small stones are seen within the gallbladder. There is a small amount of pericholecystic fluid. The gallbladder wall is thickened at 0.7 cm.  Sonographer reports negative Murphy's sign.  Common bile duct:  Measures 0.5 cm.  Liver:  No focal lesion identified.  Within normal limits in parenchymal echogenicity.  IVC:  Appears  normal.  Pancreas:  No focal abnormality seen. Small amount of fluid about the pancreas seen on the CT scan is not appreciated on this exam.  Spleen:  Measures 8.8 cm and appears normal.  Right Kidney:  Measures 11.2 cm and appears normal.  Left Kidney:  Measures 11.7 cm and appears normal.  Abdominal aorta:  No aneurysm identified.  IMPRESSION:  1.  Although the sonographer reports negative Murphy's sign, there are findings worrisome for acute cholecystitis with gallbladder wall thickening, pericholecystic fluid and small stones identified. 2.  Peripancreatic inflammatory change seen on CT scan is not appreciated on this exam. 3. These results will be called to the ordering clinician or representative by the Radiologist Assistant, and communication documented in the PACS Dashboard.  Original Report Authenticated By: Bernadene Bell. Maricela Curet, M.D.   Ct Abdomen Pelvis W Contrast  03/09/2011  *RADIOLOGY REPORT*  Clinical Data: Lower abdominal pain.  Pain for 2-3 days.  CT ABDOMEN AND PELVIS WITH CONTRAST  Technique:  Multidetector CT imaging of the abdomen and pelvis was performed following the standard protocol during bolus administration of intravenous contrast.  Contrast: OMNIPAQUE IOHEXOL 300 MG/ML IV SOLN  Comparison: None.  Findings: Dependent atelectasis at the lung bases.  Visualized heart is grossly normal.  Small hiatal hernia.  Mild intrahepatic biliary ductal dilation is present.  No calcified gallstones are identified.  There may be mild pericholecystic fluid or wall thickening.  Peripancreatic phlegmon is present without focal fluid collections.  Fluid tracks along the posterior proximal fundus of the stomach.  The gallbladder fluid or wall thickening may be associated with pancreatitis.  Portal vein remains patent.  Splenic vein normal.  No vascular complications of pancreatitis.  The common bile duct is within normal limits for diameter. Inflammation of the duodenum and adjacent small bowel  associated with pancreatitis.  No pancreatic necrosis.  The adrenal glands appear normal.  Normal renal enhancement and delayed excretion of contrast.  Mild atherosclerosis. Colon appears within normal limits.  Normal appendix in the right lower quadrant.  Urinary bladder is normal.  Both ureters are normal.  No adenopathy.  No aggressive osseous lesions.  IMPRESSION: 1.  Acute pancreatitis.  No focal fluid collections/pseudocyst identified. 2.  Mild gallbladder wall thickening.  This may represent adjacent inflammatory change from pancreatitis.  No calcified gallstones are identified.  Original Report Authenticated By: Andreas Newport, M.D.    Assessment: Principal Problem:  Abdominal pain Cholecystitis +/- mild pancreatitis, lipase not elevated. Active Problems:  Asthma-not on meds  Obesity (BMI 30-39.9)  Leukocytosis  Plan: Continue abx Will need lap chole. Plan for OR tomorrow, clears only tonight, NPO p MN I discussed the procedure in detail.  We discussed the risks and benefits of a laparoscopic cholecystectomy and possible cholangiogram including, but not limited to bleeding, infection, injury to surrounding structures such as the intestine or liver, bile leak, retained gallstones, need to convert to an open procedure, prolonged diarrhea, blood clots such as  DVT, common bile duct injury, anesthesia risks, and possible need for additional procedures.  The likelihood of improvement in symptoms and return to the patient's normal status is good. We discussed the typical post-operative recovery course.      Marianna Fuss PA-C 03/09/2011, 3:48 PM

## 2011-03-10 ENCOUNTER — Encounter (HOSPITAL_COMMUNITY): Payer: Self-pay | Admitting: Anesthesiology

## 2011-03-10 ENCOUNTER — Inpatient Hospital Stay (HOSPITAL_COMMUNITY): Payer: Self-pay | Admitting: Anesthesiology

## 2011-03-10 ENCOUNTER — Other Ambulatory Visit (INDEPENDENT_AMBULATORY_CARE_PROVIDER_SITE_OTHER): Payer: Self-pay | Admitting: General Surgery

## 2011-03-10 ENCOUNTER — Encounter (HOSPITAL_COMMUNITY)
Admission: EM | Disposition: A | Discharge status: Home / Self Care | Payer: Self-pay | Source: Home / Self Care | Attending: Family Medicine

## 2011-03-10 HISTORY — PX: CHOLECYSTECTOMY: SHX55

## 2011-03-10 LAB — CBC
HCT: 30.4 % — ABNORMAL LOW (ref 36.0–46.0)
Hemoglobin: 10 g/dL — ABNORMAL LOW (ref 12.0–15.0)
MCH: 28 pg (ref 26.0–34.0)
MCHC: 32.9 g/dL (ref 30.0–36.0)
MCV: 85.2 fL (ref 78.0–100.0)
Platelets: 315 10*3/uL (ref 150–400)
RBC: 3.57 MIL/uL — ABNORMAL LOW (ref 3.87–5.11)
RDW: 13.7 % (ref 11.5–15.5)
WBC: 9.3 10*3/uL (ref 4.0–10.5)

## 2011-03-10 LAB — COMPREHENSIVE METABOLIC PANEL
ALT: 24 U/L (ref 0–35)
AST: 31 U/L (ref 0–37)
Albumin: 3.2 g/dL — ABNORMAL LOW (ref 3.5–5.2)
Alkaline Phosphatase: 103 U/L (ref 39–117)
BUN: 9 mg/dL (ref 6–23)
CO2: 23 mEq/L (ref 19–32)
Calcium: 8 mg/dL — ABNORMAL LOW (ref 8.4–10.5)
Chloride: 105 mEq/L (ref 96–112)
Creatinine, Ser: 0.8 mg/dL (ref 0.50–1.10)
GFR calc Af Amer: >90 mL/min (ref >90)
GFR calc non Af Amer: 85 mL/min — ABNORMAL LOW (ref >90)
Glucose, Bld: 89 mg/dL (ref 70–99)
Potassium: 3.5 mEq/L (ref 3.5–5.1)
Sodium: 137 mEq/L (ref 135–145)
Total Bilirubin: 0.4 mg/dL (ref 0.3–1.2)
Total Protein: 6.7 g/dL (ref 6.0–8.3)

## 2011-03-10 LAB — LIPID PANEL
Cholesterol: 178 mg/dL (ref 0–200)
HDL: 38 mg/dL — ABNORMAL LOW (ref >39)
LDL Cholesterol: 108 mg/dL — ABNORMAL HIGH (ref 0–99)
Total CHOL/HDL Ratio: 4.7 RATIO
Triglycerides: 158 mg/dL — ABNORMAL HIGH (ref <150)
VLDL: 32 mg/dL (ref 0–40)

## 2011-03-10 LAB — AMYLASE: Amylase: 712 U/L — ABNORMAL HIGH (ref 0–105)

## 2011-03-10 LAB — PROTIME-INR
INR: 1.09 (ref 0.00–1.49)
Prothrombin Time: 14.3 seconds (ref 11.6–15.2)

## 2011-03-10 LAB — SURGICAL PCR SCREEN
MRSA, PCR: NEGATIVE
Staphylococcus aureus: NEGATIVE

## 2011-03-10 LAB — LIPASE, BLOOD: Lipase: 710 U/L — ABNORMAL HIGH (ref 11–59)

## 2011-03-10 SURGERY — LAPAROSCOPIC CHOLECYSTECTOMY
Anesthesia: General | Site: Abdomen | Wound class: Clean Contaminated

## 2011-03-10 MED ORDER — ONDANSETRON HCL 4 MG/2ML IJ SOLN
INTRAMUSCULAR | Status: DC | PRN
  Administered 2011-03-10: 4 mg via INTRAVENOUS

## 2011-03-10 MED ORDER — SUCCINYLCHOLINE CHLORIDE 20 MG/ML IJ SOLN
INTRAMUSCULAR | Status: DC | PRN
  Administered 2011-03-10: 140 mg via INTRAVENOUS

## 2011-03-10 MED ORDER — SODIUM CHLORIDE 0.9 % IV SOLN
INTRAVENOUS | Status: DC
  Administered 2011-03-10 – 2011-03-11 (×2): via INTRAVENOUS

## 2011-03-10 MED ORDER — NEOSTIGMINE METHYLSULFATE 1 MG/ML IJ SOLN
INTRAMUSCULAR | Status: DC | PRN
  Administered 2011-03-10: 4 mg via INTRAVENOUS

## 2011-03-10 MED ORDER — MEPERIDINE HCL 25 MG/ML IJ SOLN: 6.2500 mg | INTRAMUSCULAR | Status: DC | PRN

## 2011-03-10 MED ORDER — HEMOSTATIC AGENTS (NO CHARGE) OPTIME
TOPICAL | Status: DC | PRN
  Administered 2011-03-10 (×2): 1 via TOPICAL

## 2011-03-10 MED ORDER — ACETAMINOPHEN 325 MG PO TABS
650.0000 mg | ORAL_TABLET | Freq: Four times a day (QID) | ORAL | Status: DC | PRN
  Administered 2011-03-10: 650 mg via ORAL
  Filled 2011-03-10: qty 2

## 2011-03-10 MED ORDER — FENTANYL CITRATE 0.05 MG/ML IJ SOLN
INTRAMUSCULAR | Status: DC | PRN
  Administered 2011-03-10: 100 ug via INTRAVENOUS
  Administered 2011-03-10 (×2): 50 ug via INTRAVENOUS
  Administered 2011-03-10: 100 ug via INTRAVENOUS
  Administered 2011-03-10 (×2): 50 ug via INTRAVENOUS

## 2011-03-10 MED ORDER — ACETAMINOPHEN 325 MG PO TABS
650.0000 mg | ORAL_TABLET | Freq: Four times a day (QID) | ORAL | Status: DC | PRN
  Administered 2011-03-11: 650 mg via ORAL
  Filled 2011-03-10: qty 2

## 2011-03-10 MED ORDER — GLYCOPYRROLATE 0.2 MG/ML IJ SOLN
INTRAMUSCULAR | Status: DC | PRN
  Administered 2011-03-10: .6 mg via INTRAVENOUS

## 2011-03-10 MED ORDER — HYDROMORPHONE HCL PF 1 MG/ML IJ SOLN
0.2500 mg | INTRAMUSCULAR | Status: DC | PRN
  Administered 2011-03-10: 0.5 mg via INTRAVENOUS
  Filled 2011-03-10: qty 1
  Filled 2011-03-10: qty 0.5

## 2011-03-10 MED ORDER — LACTATED RINGERS IV SOLN
INTRAVENOUS | Status: DC | PRN
  Administered 2011-03-10 (×2): via INTRAVENOUS

## 2011-03-10 MED ORDER — MIDAZOLAM HCL 5 MG/5ML IJ SOLN
INTRAMUSCULAR | Status: DC | PRN
  Administered 2011-03-10: 2 mg via INTRAVENOUS

## 2011-03-10 MED ORDER — DEXAMETHASONE SODIUM PHOSPHATE 4 MG/ML IJ SOLN
INTRAMUSCULAR | Status: DC | PRN
  Administered 2011-03-10: 4 mg via INTRAVENOUS

## 2011-03-10 MED ORDER — LACTATED RINGERS IV SOLN
INTRAVENOUS | Status: DC
  Administered 2011-03-10: 15:00:00 via INTRAVENOUS

## 2011-03-10 MED ORDER — DEXTROSE 5 % IV SOLN
1.0000 g | Freq: Once | INTRAVENOUS | Status: AC
  Administered 2011-03-11: 1 g via INTRAVENOUS
  Filled 2011-03-10: qty 10

## 2011-03-10 MED ORDER — BUPIVACAINE-EPINEPHRINE 0.25% -1:200000 IJ SOLN
INTRAMUSCULAR | Status: DC | PRN
  Administered 2011-03-10: 17 mL

## 2011-03-10 MED ORDER — PROMETHAZINE HCL 25 MG/ML IJ SOLN: 6.2500 mg | INTRAMUSCULAR | Status: DC | PRN

## 2011-03-10 MED ORDER — ONDANSETRON HCL 4 MG/2ML IJ SOLN: 4.0000 mg | Freq: Four times a day (QID) | INTRAMUSCULAR | Status: DC | PRN

## 2011-03-10 MED ORDER — OXYCODONE HCL 5 MG PO TABS
5.0000 mg | ORAL_TABLET | ORAL | Status: DC | PRN
  Administered 2011-03-11 (×2): 5 mg via ORAL
  Filled 2011-03-10 (×2): qty 1

## 2011-03-10 MED ORDER — ACETAMINOPHEN 650 MG RE SUPP: 650.0000 mg | Freq: Four times a day (QID) | RECTAL | Status: DC | PRN

## 2011-03-10 MED ORDER — SODIUM CHLORIDE 0.9 % IR SOLN
Status: DC | PRN
  Administered 2011-03-10: 1000 mL

## 2011-03-10 MED ORDER — ROCURONIUM BROMIDE 100 MG/10ML IV SOLN
INTRAVENOUS | Status: DC | PRN
  Administered 2011-03-10: 40 mg via INTRAVENOUS
  Administered 2011-03-10: 5 mg via INTRAVENOUS

## 2011-03-10 MED ORDER — MORPHINE SULFATE 2 MG/ML IJ SOLN: 2.0000 mg | INTRAMUSCULAR | Status: DC | PRN

## 2011-03-10 MED ORDER — PROPOFOL 10 MG/ML IV EMUL
INTRAVENOUS | Status: DC | PRN
  Administered 2011-03-10: 160 mg via INTRAVENOUS

## 2011-03-10 MED ORDER — SODIUM CHLORIDE 0.9 % IR SOLN
Status: DC | PRN
  Administered 2011-03-10: 2000 mL

## 2011-03-10 SURGICAL SUPPLY — 44 items
ADH SKN CLS APL DERMABOND .7 (GAUZE/BANDAGES/DRESSINGS) ×2
ADH SKN CLS LQ APL DERMABOND (GAUZE/BANDAGES/DRESSINGS) ×2
APPLIER CLIP 5 13 M/L LIGAMAX5 (MISCELLANEOUS) ×3
APR CLP MED LRG 5 ANG JAW (MISCELLANEOUS) ×2
BAG SPEC RTRVL LRG 6X4 10 (ENDOMECHANICALS) ×2
BLADE SURG ROTATE 9660 (MISCELLANEOUS) IMPLANT
CANISTER SUCTION 2500CC (MISCELLANEOUS) ×3 IMPLANT
CATH REDDICK CHOLANGI 4FR 50CM (CATHETERS) IMPLANT
CHLORAPREP W/TINT 26ML (MISCELLANEOUS) ×3 IMPLANT
CLIP APPLIE 5 13 M/L LIGAMAX5 (MISCELLANEOUS) ×2 IMPLANT
CLOTH BEACON ORANGE TIMEOUT ST (SAFETY) ×3 IMPLANT
COVER MAYO STAND STRL (DRAPES) ×3 IMPLANT
COVER SURGICAL LIGHT HANDLE (MISCELLANEOUS) ×3 IMPLANT
DECANTER SPIKE VIAL GLASS SM (MISCELLANEOUS) ×3 IMPLANT
DERMABOND ADHESIVE PROPEN (GAUZE/BANDAGES/DRESSINGS) ×1
DERMABOND ADVANCED (GAUZE/BANDAGES/DRESSINGS) ×1
DERMABOND ADVANCED .7 DNX12 (GAUZE/BANDAGES/DRESSINGS) ×2 IMPLANT
DERMABOND ADVANCED .7 DNX6 (GAUZE/BANDAGES/DRESSINGS) ×2 IMPLANT
DRAPE C-ARM 42X72 X-RAY (DRAPES) IMPLANT
ELECT REM PT RETURN 9FT ADLT (ELECTROSURGICAL) ×3
ELECTRODE REM PT RTRN 9FT ADLT (ELECTROSURGICAL) ×2 IMPLANT
GLOVE BIO SURGEON STRL SZ7 (GLOVE) ×6 IMPLANT
GLOVE BIOGEL PI IND STRL 7.5 (GLOVE) ×2 IMPLANT
GLOVE BIOGEL PI INDICATOR 7.5 (GLOVE) ×1
GOWN PREVENTION PLUS XLARGE (GOWN DISPOSABLE) ×3 IMPLANT
GOWN STRL NON-REIN LRG LVL3 (GOWN DISPOSABLE) ×9 IMPLANT
IV CATH 14GX2 1/4 (CATHETERS) IMPLANT
KIT BASIN OR (CUSTOM PROCEDURE TRAY) ×3 IMPLANT
KIT ROOM TURNOVER OR (KITS) ×3 IMPLANT
NS IRRIG 1000ML POUR BTL (IV SOLUTION) ×3 IMPLANT
PAD ARMBOARD 7.5X6 YLW CONV (MISCELLANEOUS) ×6 IMPLANT
POUCH SPECIMEN RETRIEVAL 10MM (ENDOMECHANICALS) ×3 IMPLANT
SCISSORS LAP 5X35 DISP (ENDOMECHANICALS) ×3 IMPLANT
SET CHOLANGIOGRAPH 5 50 .035 (SET/KITS/TRAYS/PACK) IMPLANT
SET IRRIG TUBING LAPAROSCOPIC (IRRIGATION / IRRIGATOR) ×3 IMPLANT
SLEEVE ENDOPATH XCEL 5M (ENDOMECHANICALS) ×6 IMPLANT
SPECIMEN JAR SMALL (MISCELLANEOUS) ×3 IMPLANT
SUT MNCRL AB 4-0 PS2 18 (SUTURE) ×3 IMPLANT
TOWEL OR 17X24 6PK STRL BLUE (TOWEL DISPOSABLE) ×3 IMPLANT
TOWEL OR 17X26 10 PK STRL BLUE (TOWEL DISPOSABLE) ×3 IMPLANT
TRAY LAPAROSCOPIC (CUSTOM PROCEDURE TRAY) ×3 IMPLANT
TROCAR XCEL BLUNT TIP 100MML (ENDOMECHANICALS) ×3 IMPLANT
TROCAR XCEL NON-BLD 5MMX100MML (ENDOMECHANICALS) ×3 IMPLANT
WATER STERILE IRR 1000ML POUR (IV SOLUTION) IMPLANT

## 2011-03-10 NOTE — Anesthesia Preprocedure Evaluation (Addendum)
Anesthesia Evaluation  Patient identified by MRN, date of birth, ID band Patient awake    Reviewed: Allergy & Precautions, H&P , NPO status   History of Anesthesia Complications (+) PONV  Airway Mallampati: II TM Distance: >3 FB Neck ROM: Full    Dental  (+) Upper Dentures, Lower Dentures and Dental Advisory Given   Pulmonary asthma ,  clear to auscultation  Pulmonary exam normal       Cardiovascular + DOE Regular Normal    Neuro/Psych  Headaches, Depression    GI/Hepatic Neg liver ROS,   Endo/Other  Negative Endocrine ROS  Renal/GU negative Renal ROS  Genitourinary negative   Musculoskeletal negative musculoskeletal ROS (+)   Abdominal Normal abdominal exam  (+) obese,   Peds  Hematology negative hematology ROS (+)   Anesthesia Other Findings   Reproductive/Obstetrics negative OB ROS                          Anesthesia Physical Anesthesia Plan  ASA: II  Anesthesia Plan: General   Post-op Pain Management:    Induction: Intravenous  Airway Management Planned: Oral ETT  Additional Equipment:   Intra-op Plan:   Post-operative Plan: Extubation in OR  Informed Consent: I have reviewed the patients History and Physical, chart, labs and discussed the procedure including the risks, benefits and alternatives for the proposed anesthesia with the patient or authorized representative who has indicated his/her understanding and acceptance.   Dental advisory given  Plan Discussed with: Anesthesiologist  Anesthesia Plan Comments:         Anesthesia Quick Evaluation

## 2011-03-10 NOTE — Progress Notes (Signed)
Subjective: Complains of ruq pain  Objective: Vital signs in last 24 hours: Temp:  [97.6 F (36.4 C)-98.5 F (36.9 C)] 97.9 F (36.6 C) (01/10 0527) Pulse Rate:  [62-75] 67  (01/10 0527) Resp:  [18-20] 18  (01/10 0527) BP: (99-111)/(43-61) 109/50 mmHg (01/10 0527) SpO2:  [98 %-99 %] 98 % (01/10 0527) Weight:  [208 lb 12.8 oz (94.711 kg)] 208 lb 12.8 oz (94.711 kg) (01/09 0758) Last BM Date: 03/08/11  Intake/Output from previous day: 01/09 0701 - 01/10 0700 In: 770 [P.O.:720; IV Piggyback:50] Out: -  Intake/Output this shift:    GI: abnormal findings:  ruq tenderness  Lab Results:   [facility] 03/08/11 2215  WBC 16.9*  HGB 11.3*  HCT 33.7*  PLT 407*   BMET  Basename 03/08/11 2215  NA 138  K 3.6  CL 103  CO2 21  GLUCOSE 115*  BUN 14  CREATININE 0.90  CALCIUM 9.1   PT/INR No results found for this basename: LABPROT:2,INR:2 in the last 72 hours ABG No results found for this basename: PHART:2,PCO2:2,PO2:2,HCO3:2 in the last 72 hours  Studies/Results: US Abdomen Complete  03/09/2011  *RADIOLOGY REPORT*  Clinical Data:  Pancreatitis.  Abdominal pain, nausea and vomiting.  COMPLETE ABDOMINAL ULTRASOUND  Comparison:  CT abdomen and pelvis 03/09/2011.  Findings:  Gallbladder:  A few small stones are seen within the gallbladder. There is a small amount of pericholecystic fluid. The gallbladder wall is thickened at 0.7 cm.  Sonographer reports negative Murphy's sign.  Common bile duct:  Measures 0.5 cm.  Liver:  No focal lesion identified.  Within normal limits in parenchymal echogenicity.  IVC:  Appears normal.  Pancreas:  No focal abnormality seen. Small amount of fluid about the pancreas seen on the CT scan is not appreciated on this exam.  Spleen:  Measures 8.8 cm and appears normal.  Right Kidney:  Measures 11.2 cm and appears normal.  Left Kidney:  Measures 11.7 cm and appears normal.  Abdominal aorta:  No aneurysm identified.  IMPRESSION:  1.  Although the sonographer  reports negative Murphy's sign, there are findings worrisome for acute cholecystitis with gallbladder wall thickening, pericholecystic fluid and small stones identified. 2.  Peripancreatic inflammatory change seen on CT scan is not appreciated on this exam. 3. These results will be called to the ordering clinician or representative by the Radiologist Assistant, and communication documented in the PACS Dashboard.  Original Report Authenticated By: Bernadene Bell. Maricela Curet, M.D.   Ct Abdomen Pelvis W Contrast  03/09/2011  *RADIOLOGY REPORT*  Clinical Data: Lower abdominal pain.  Pain for 2-3 days.  CT ABDOMEN AND PELVIS WITH CONTRAST  Technique:  Multidetector CT imaging of the abdomen and pelvis was performed following the standard protocol during bolus administration of intravenous contrast.  Contrast: OMNIPAQUE IOHEXOL 300 MG/ML IV SOLN  Comparison: None.  Findings: Dependent atelectasis at the lung bases.  Visualized heart is grossly normal.  Small hiatal hernia.  Mild intrahepatic biliary ductal dilation is present.  No calcified gallstones are identified.  There may be mild pericholecystic fluid or wall thickening.  Peripancreatic phlegmon is present without focal fluid collections.  Fluid tracks along the posterior proximal fundus of the stomach.  The gallbladder fluid or wall thickening may be associated with pancreatitis.  Portal vein remains patent.  Splenic vein normal.  No vascular complications of pancreatitis.  The common bile duct is within normal limits for diameter. Inflammation of the duodenum and adjacent small bowel associated with pancreatitis.  No pancreatic  necrosis.  The adrenal glands appear normal.  Normal renal enhancement and delayed excretion of contrast.  Mild atherosclerosis. Colon appears within normal limits.  Normal appendix in the right lower quadrant.  Urinary bladder is normal.  Both ureters are normal.  No adenopathy.  No aggressive osseous lesions.  IMPRESSION: 1.  Acute  pancreatitis.  No focal fluid collections/pseudocyst identified. 2.  Mild gallbladder wall thickening.  This may represent adjacent inflammatory change from pancreatitis.  No calcified gallstones are identified.  Original Report Authenticated By: Andreas Newport, M.D.    Anti-infectives: Anti-infectives     Start     Dose/Rate Route Frequency Ordered Stop   03/09/11 1130   cefTRIAXone (ROCEPHIN) 1 g in dextrose 5 % 50 mL IVPB        1 g 100 mL/hr over 30 Minutes Intravenous Every 24 hours 03/09/11 1014     03/09/11 0600   cefTRIAXone (ROCEPHIN) 1 g in dextrose 5 % 50 mL IVPB        1 g 100 mL/hr over 30 Minutes Intravenous  Once 03/09/11 0548 03/09/11 1610          Assessment/Plan: Acute cholecystitis  Plan lap chole today  I discussed the procedure in detail.    We discussed the risks and benefits of a laparoscopic cholecystectomy and possible cholangiogram including, but not limited to bleeding, infection, injury to surrounding structures such as the intestine or liver, bile leak, retained gallstones, need to convert to an open procedure, prolonged diarrhea, blood clots such as  DVT, common bile duct injury, anesthesia risks, and possible need for additional procedures.  The likelihood of improvement in symptoms and return to the patient's normal status is good. We discussed the typical post-operative recovery course.   LOS: 2 days    [facility] 03/10/2011

## 2011-03-10 NOTE — Anesthesia Postprocedure Evaluation (Signed)
Anesthesia Post Note  Patient: Angelica Frank  Procedure(s) Performed:  LAPAROSCOPIC CHOLECYSTECTOMY  Anesthesia type: General  Patient location: PACU  Post pain: Pain level controlled and Adequate analgesia  Post assessment: Post-op Vital signs reviewed, Patient's Cardiovascular Status Stable, Respiratory Function Stable, Patent Airway and Pain level controlled  Last Vitals:  Filed Vitals:   03/10/11 1745  BP:   Pulse:   Temp: 37 C  Resp:     Post vital signs: Reviewed and stable  Level of consciousness: awake, alert  and oriented  Complications: No apparent anesthesia complications

## 2011-03-10 NOTE — Transfer of Care (Signed)
Immediate Anesthesia Transfer of Care Note  Patient: Angelica Frank  Procedure(s) Performed:  LAPAROSCOPIC CHOLECYSTECTOMY  Patient Location: PACU  Anesthesia Type: General  Level of Consciousness: awake, oriented and patient cooperative  Airway & Oxygen Therapy: Patient Spontanous Breathing and Patient connected to nasal cannula oxygen  Post-op Assessment: Report given to PACU RN and Post -op Vital signs reviewed and stable  Post vital signs: Reviewed Filed Vitals:   03/10/11 1745  BP:   Pulse:   Temp: 37 C  Resp:     Complications: No apparent anesthesia complications

## 2011-03-10 NOTE — Op Note (Signed)
Diagnosis: Acute cholecystitis Postoperative diagnosis: Same as above  Procedure: Laparoscopic cholecystectomy Surgeon: Dr. Harden Mo Asst.: Dr. Violeta Gelinas Specimens: Gallbladder and contents to pathology Estimated blood loss: 75 cc Drains: None Anesthesia: Gen. Endotracheal Sponge and needle count correct x2 at end of operation Disposition patient to recovery room in stable condition  Indications: This is a 51 year old female who was admitted by the medical service with initial diagnosis of pancreatitis. This was later found to be cholecystitis and she had an ultrasound consistent with a normal lipase but did have an elevated white count. On my exam she clearly had right upper quadrant tenderness an ultrasound was consistent with cholecystitis. She was placed on antibiotics we discussed laparoscopic cholecystectomy with the wrist and benefits associated with that.  Procedure: After informed consent was obtained the patient was taken to the operating room. She was on Rocephin on the floor per the medical service. She had sequential compression devices placed her legs prior to induction of anesthesia. She was then placed under general anesthesia without complication. She was then prepped and draped in the standard sterile surgical fashion. Surgical timeout was then performed.  I infiltrated quarter percent Marcaine above her umbilicus. She had a prior tubal ligation with an infraumbilical incision so I elected to go above her umbilicus. I then made a vertical incision above her umbilicus. I grasped her umbilical stalk. I entered the fascia sharply and the peritoneum bluntly. I placed a 0 Vicryl pursestring suture through the fascia. I then introduced a Hassan trocar and insufflated the abdomen to 15 mm mercury pressure. I then placed 3 further 5 mm trocars in the epigastrium and right upper quadrant under direct vision after infiltration with local anesthetic without complication. She was  noted to have a fair amount of adhesions and clearly had acute cholecystitis. There was some fat adherent to her liver that when this was grasped it removed the capsule of the liver. This was cauterized and was hemostatic. The gallbladder was then retracted cephalad. There were some adhesions to her duodenum that were taken down with a combination of blunt dissection and sharp dissection. Once the duodenum was completely free we then retracted the gallbladder cephalad and lateral. She had a  fair amount of inflammation indicative of acute cholecystitis. Eventually with some trouble I was able to obtain the critical view of safety. I then clipped the artery and divided it. Her cystic duct was short and there was a posterior branch from the cystic duct going into the right lobe of the liver as well. I clearly identified the cystic duct and did not do a cholangiogram as it was short. I then clipped the duct near the gallbladder and divided. The gallbladder was then removed from the liver bed with some difficulty. This is where the blood loss occurred during the procedure. I did not enter into the gallbladder during the procedure. The gallbladder fascia was removed the liver placed in an Endo Catch bag and removed from the umbilicus. I then took about 15 minutes to obtain hemostasis on the gallbladder fossa. I irrigated with 2 L. I then placed 2 pieces of Surgicel cell and the gallbladder fossa after it was hemostatic. I then removed the [facility] trocar and tied the stitch down. This obliterated the defect. Uterus from the epigastric port and there was no evidence of an entry injury. I then desufflated the abdomen and removed all trocars. The incisions were then closed with Monocryl and Dermabond. She tolerated this well was extubated in  the operating room and transferred to recovery in stable condition.

## 2011-03-10 NOTE — Progress Notes (Signed)
Appreciate patient being seen by General Surgery Discussed with service yesterday to kindly assume her care, given he rpriamry surgical issues and no real medical issues concerning her care. Will change attending to Dr. Dwain Sarna  Call me with Q's  Pleas Koch, MD [facility] (P) [phone]

## 2011-03-10 NOTE — Preoperative (Signed)
Beta Blockers   Reason not to administer Beta Blockers:Not Applicable 

## 2011-03-10 NOTE — Progress Notes (Signed)
Asked to speak to cousin of a surgical pt who had come to nurses station on 5100. She had questions about the patient...  1. Said pt has no insurance coverage and would need help with medications. A: I explained to her that the hospital is only able to assist with 3 days worth of medications at d/c but that the MD would write for low cost generic meds as much as possible. I did not tell her that narcotic pain meds were not available.   2.  She said someone met with the patient and told her they would help to get Medicaid for the patient.  A:  [employer], Morrie Sheldon @ [phone], who said that she had met with the patient and that she would not qualify for Medicaid unless she was to be disabled for 12+ months.  This seems unlikely since the patient is here for a lap chole. She also shared that the patient had previously been living in public housing but the family pulled her out of there and she is now living with them.   3.  She was concerned that the patient was not in a private room.  She said she wanted to talk to the doctor and have him get the patient moved.  A: Explained to her that the MD has no control over the patient's room, private vs semi-private. Explained that the hospital was full at this time. Charge RN told her that the patient was #1 on a list to be moved as soon as a private room opened up and that that may or may not be today.

## 2011-03-11 ENCOUNTER — Encounter (HOSPITAL_COMMUNITY): Payer: Self-pay | Admitting: General Surgery

## 2011-03-11 LAB — GLUCOSE, CAPILLARY: Glucose-Capillary: 119 mg/dL — ABNORMAL HIGH (ref 70–99)

## 2011-03-11 MED ORDER — INFLUENZA VIRUS VACC SPLIT PF IM SUSP
0.5000 mL | Freq: Once | INTRAMUSCULAR | Status: AC
  Administered 2011-03-11: 0.5 mL via INTRAMUSCULAR
  Filled 2011-03-11: qty 0.5

## 2011-03-11 MED ORDER — OXYCODONE HCL 5 MG PO TABS: 5.0000 mg | ORAL_TABLET | ORAL | Status: AC | PRN

## 2011-03-11 NOTE — Progress Notes (Signed)
Patient discharged to home in care of family. Medications and instructions reviewed with patient and family with no questions. IV d/c'd with cath intact. Assessment unchanged from this am. Patient is to follow up with [facility] on 1.22.13.

## 2011-03-11 NOTE — Progress Notes (Signed)
Feels well, has not eaten much yet, will dc later today if does well

## 2011-03-11 NOTE — Progress Notes (Signed)
UR of chart completed.  

## 2011-03-11 NOTE — Discharge Summary (Signed)
Physician Discharge Summary  Patient ID: Angelica Frank MRN: [id number] DOB/AGE: [birth year] 51 y.o.  Admit date: 03/08/2011 Discharge date: 03/11/2011  Admission Diagnoses:  Discharge Diagnoses:  Principal Problem:  *Abdominal pain-Ddx Pancreatitis vs Chlocystits vs UTI Active Problems:  Asthma-not on meds  Obesity (BMI 30-39.9)  Leucocytosis  Cholecystitis   Discharged Condition: good  Hospital Course: This is a 51 year old Caucasian female admitted for acute cholecystitis s/p cholecystectomy. Initially the patient was admitted to the [facility] due to concern that the patient's abdominal pain was due to pancreatitis with possible inflammatory changes seen on her CT-abdomen and an elevated lipase of 710. However, her abdominal pain was later attributed to acute cholecystitis based on ultrasound findings (thickened gallbladder wall and pericholecystic fluid and absence of signs of pancreatitis). Consequently, she was transferred to the General Surgery service and underwent a laparoscopic cholecystectomy on 03/10/2011. The surgery went as expected without complications. Please refer to operative note for full details. The patient did well post-operatively. Her abdominal pain was minimal and was limited to her incision sites, which were clean, dry, and intact and she had positive flatus and bowel sounds. The patient felt comfortable going home and was discharged on POD#1 after she tolerated her diet without problem.   Follow-up: 03/22/2011 with [facility].   Consults: none  Disposition: Home or Self Care   Medication List  As of 03/11/2011  8:37 AM   ASK your doctor about these medications         acetaminophen 500 MG tablet   Commonly known as: TYLENOL   Take 500-1,000 mg by mouth every 6 (six) hours as needed. For headache/pain             Signed: [place] PARK, [name] 03/11/2011, 8:37 AM

## 2011-03-11 NOTE — Progress Notes (Signed)
1 Day Post-Op  Subjective: No new complaints.  Her abdominal pain is minimal. Denies nausea.   Objective: Vital signs in last 24 hours: Temp:  [97.8 F (36.6 C)-99.5 F (37.5 C)] 98.7 F (37.1 C) (01/11 0500) Pulse Rate:  [63-90] 90  (01/11 0500) Resp:  [18-20] 18  (01/11 0500) BP: (111-124)/(44-65) 113/58 mmHg (01/11 0500) SpO2:  [95 %-99 %] 95 % (01/11 0500) Last BM Date: 03/09/11  Intake/Output from previous day: 01/10 0701 - 01/11 0700 In: 2450.3 [I.V.:2400.3; IV Piggyback:50] Out: 850 [Urine:800; Blood:50] Intake/Output this shift:    Physical exam: Gen: NAD, ambulating without problem Abd: decreased BS, soft, NT, ND, incision sites clean/dry/intact  Lab Results:   [facility] 03/10/11 0738 03/08/11 2215  WBC 9.3 16.9*  HGB 10.0* 11.3*  HCT 30.4* 33.7*  PLT 315 407*   BMET  Basename 03/10/11 0738 03/08/11 2215  NA 137 138  K 3.5 3.6  CL 105 103  CO2 23 21  GLUCOSE 89 115*  BUN 9 14  CREATININE 0.80 0.90  CALCIUM 8.0* 9.1   PT/INR  Basename 03/10/11 0738  LABPROT 14.3  INR 1.09   ABG No results found for this basename: PHART:2,PCO2:2,PO2:2,HCO3:2 in the last 72 hours  Studies/Results: US Abdomen Complete  03/09/2011  *RADIOLOGY REPORT*  Clinical Data:  Pancreatitis.  Abdominal pain, nausea and vomiting.  COMPLETE ABDOMINAL ULTRASOUND  Comparison:  CT abdomen and pelvis 03/09/2011.  Findings:  Gallbladder:  A few small stones are seen within the gallbladder. There is a small amount of pericholecystic fluid. The gallbladder wall is thickened at 0.7 cm.  Sonographer reports negative Murphy's sign.  Common bile duct:  Measures 0.5 cm.  Liver:  No focal lesion identified.  Within normal limits in parenchymal echogenicity.  IVC:  Appears normal.  Pancreas:  No focal abnormality seen. Small amount of fluid about the pancreas seen on the CT scan is not appreciated on this exam.  Spleen:  Measures 8.8 cm and appears normal.  Right Kidney:  Measures 11.2 cm and  appears normal.  Left Kidney:  Measures 11.7 cm and appears normal.  Abdominal aorta:  No aneurysm identified.  IMPRESSION:  1.  Although the sonographer reports negative Murphy's sign, there are findings worrisome for acute cholecystitis with gallbladder wall thickening, pericholecystic fluid and small stones identified. 2.  Peripancreatic inflammatory change seen on CT scan is not appreciated on this exam. 3. These results will be called to the ordering clinician or representative by the Radiologist Assistant, and communication documented in the PACS Dashboard.  Original Report Authenticated By: Bernadene Bell. Maricela Curet, M.D.    Anti-infectives: Anti-infectives     Start     Dose/Rate Route Frequency Ordered Stop   03/11/11 0000   cefTRIAXone (ROCEPHIN) 1 g in dextrose 5 % 50 mL IVPB        1 g 100 mL/hr over 30 Minutes Intravenous  Once 03/10/11 1842 03/11/11 0105   03/09/11 1130   cefTRIAXone (ROCEPHIN) 1 g in dextrose 5 % 50 mL IVPB  Status:  Discontinued        1 g 100 mL/hr over 30 Minutes Intravenous Every 24 hours 03/09/11 1014 03/10/11 1842   03/09/11 0600   cefTRIAXone (ROCEPHIN) 1 g in dextrose 5 % 50 mL IVPB        1 g 100 mL/hr over 30 Minutes Intravenous  Once 03/09/11 0548 03/09/11 0711          Assessment/Plan: s/p Procedure(s): LAPAROSCOPIC CHOLECYSTECTOMY  Principal Problem:  Abdominal pain  Cholecystitis +/- mild pancreatitis, lipase not elevated.  -Discharge home today  Active Problems:  Asthma-not on meds  Obesity (BMI 30-39.9)     LOS: 3 days    Angelica Frank, Angelica Frank 03/11/2011

## 2011-03-22 ENCOUNTER — Encounter (INDEPENDENT_AMBULATORY_CARE_PROVIDER_SITE_OTHER): Payer: Self-pay

## 2011-03-22 ENCOUNTER — Ambulatory Visit (INDEPENDENT_AMBULATORY_CARE_PROVIDER_SITE_OTHER): Payer: Self-pay | Admitting: Radiology

## 2011-03-22 VITALS — BP 114/70 | Ht 68.0 in | Wt 198.0 lb

## 2011-03-22 DIAGNOSIS — K819 Cholecystitis, unspecified: Secondary | ICD-10-CM

## 2011-03-22 NOTE — Progress Notes (Signed)
Angelica Frank 1960-10-01 [id number] 03/22/2011   Angelica Frank is a 51 y.o. female who had a laparoscopic cholecystectomy with intraoperative cholangiogram.  The pathology report confirmed cholecystitis and gallstones.  The patient reports that they are feeling well with normal bowel movements and good appetite.  The pre-operative symptoms of abdominal pain, nausea, and vomiting have resolved.    Physical examination - Incisions appear well-healed with no sign of infection or bleeding.   Abdomen - soft, non-tender  Impression:  s/p laparoscopic cholecystectomy  Plan:  She may resume a regular diet and full activity.  She may follow-up on a PRN basis.

## 2013-08-09 ENCOUNTER — Encounter (HOSPITAL_COMMUNITY): Payer: Self-pay | Admitting: Emergency Medicine

## 2013-08-09 ENCOUNTER — Emergency Department (HOSPITAL_COMMUNITY)
Admission: EM | Admit: 2013-08-09 | Discharge: 2013-08-10 | Disposition: A | Discharge status: Home / Self Care | Payer: Self-pay | Attending: Emergency Medicine | Admitting: Emergency Medicine

## 2013-08-09 DIAGNOSIS — F17200 Nicotine dependence, unspecified, uncomplicated: Secondary | ICD-10-CM | POA: Insufficient documentation

## 2013-08-09 DIAGNOSIS — J45909 Unspecified asthma, uncomplicated: Secondary | ICD-10-CM | POA: Insufficient documentation

## 2013-08-09 DIAGNOSIS — Z9089 Acquired absence of other organs: Secondary | ICD-10-CM | POA: Insufficient documentation

## 2013-08-09 DIAGNOSIS — J189 Pneumonia, unspecified organism: Secondary | ICD-10-CM

## 2013-08-09 DIAGNOSIS — R110 Nausea: Secondary | ICD-10-CM | POA: Insufficient documentation

## 2013-08-09 DIAGNOSIS — Z9851 Tubal ligation status: Secondary | ICD-10-CM | POA: Insufficient documentation

## 2013-08-09 DIAGNOSIS — R109 Unspecified abdominal pain: Secondary | ICD-10-CM

## 2013-08-09 DIAGNOSIS — J159 Unspecified bacterial pneumonia: Secondary | ICD-10-CM | POA: Insufficient documentation

## 2013-08-09 LAB — CBC WITH DIFFERENTIAL/PLATELET
Basophils Absolute: 0.1 10*3/uL (ref 0.0–0.1)
Basophils Relative: 0 % (ref 0–1)
Eosinophils Absolute: 0.4 10*3/uL (ref 0.0–0.7)
Eosinophils Relative: 3 % (ref 0–5)
HCT: 33.6 % — ABNORMAL LOW (ref 36.0–46.0)
Hemoglobin: 10.7 g/dL — ABNORMAL LOW (ref 12.0–15.0)
Lymphocytes Relative: 24 % (ref 12–46)
Lymphs Abs: 2.8 10*3/uL (ref 0.7–4.0)
MCH: 24.4 pg — ABNORMAL LOW (ref 26.0–34.0)
MCHC: 31.8 g/dL (ref 30.0–36.0)
MCV: 76.5 fL — ABNORMAL LOW (ref 78.0–100.0)
Monocytes Absolute: 0.7 10*3/uL (ref 0.1–1.0)
Monocytes Relative: 5 % (ref 3–12)
Neutro Abs: 8.1 10*3/uL — ABNORMAL HIGH (ref 1.7–7.7)
Neutrophils Relative %: 68 % (ref 43–77)
Platelets: 360 10*3/uL (ref 150–400)
RBC: 4.39 MIL/uL (ref 3.87–5.11)
RDW: 15.6 % — ABNORMAL HIGH (ref 11.5–15.5)
WBC: 11.9 10*3/uL — ABNORMAL HIGH (ref 4.0–10.5)

## 2013-08-09 LAB — URINALYSIS, ROUTINE W REFLEX MICROSCOPIC
Glucose, UA: NEGATIVE mg/dL
Ketones, ur: 15 mg/dL — AB
Nitrite: NEGATIVE
Protein, ur: NEGATIVE mg/dL
Specific Gravity, Urine: 1.027 (ref 1.005–1.030)
Urobilinogen, UA: 1 mg/dL (ref 0.0–1.0)
pH: 5.5 (ref 5.0–8.0)

## 2013-08-09 LAB — COMPREHENSIVE METABOLIC PANEL
ALT: 10 U/L (ref 0–35)
AST: 27 U/L (ref 0–37)
Albumin: 4.3 g/dL (ref 3.5–5.2)
Alkaline Phosphatase: 89 U/L (ref 39–117)
BUN: 20 mg/dL (ref 6–23)
CO2: 21 mEq/L (ref 19–32)
Calcium: 9.5 mg/dL (ref 8.4–10.5)
Chloride: 100 mEq/L (ref 96–112)
Creatinine, Ser: 1.09 mg/dL (ref 0.50–1.10)
GFR calc Af Amer: 67 mL/min — ABNORMAL LOW (ref >90)
GFR calc non Af Amer: 58 mL/min — ABNORMAL LOW (ref >90)
Glucose, Bld: 111 mg/dL — ABNORMAL HIGH (ref 70–99)
Potassium: 4.7 mEq/L (ref 3.7–5.3)
Sodium: 139 mEq/L (ref 137–147)
Total Bilirubin: 0.5 mg/dL (ref 0.3–1.2)
Total Protein: 8.8 g/dL — ABNORMAL HIGH (ref 6.0–8.3)

## 2013-08-09 LAB — URINE MICROSCOPIC-ADD ON

## 2013-08-09 LAB — LIPASE, BLOOD: Lipase: 33 U/L (ref 11–59)

## 2013-08-09 MED ORDER — ONDANSETRON HCL 4 MG/2ML IJ SOLN
4.0000 mg | Freq: Once | INTRAMUSCULAR | Status: AC
  Administered 2013-08-10: 4 mg via INTRAVENOUS
  Filled 2013-08-09: qty 2

## 2013-08-09 MED ORDER — SODIUM CHLORIDE 0.9 % IV BOLUS (SEPSIS)
1000.0000 mL | Freq: Once | INTRAVENOUS | Status: AC
  Administered 2013-08-10: 1000 mL via INTRAVENOUS

## 2013-08-09 NOTE — ED Provider Notes (Addendum)
CSN: [id number]     Arrival date & time 08/09/13  1824 History   First MD Initiated Contact with Patient 08/09/13 2303     Chief Complaint  Patient presents with  . Abdominal Pain     (Consider location/radiation/quality/duration/timing/severity/associated sxs/prior Treatment) HPI  This patient is an obese 53 yo woman who presents with right upper quadrant abdominal pain which began around 3pm. Pain has gradually improved. Patient says pain is almost negligible. She notes some associated nausea but no vomiting. Says that pain is in the same area as the pain which preceded cholecystectomy 1 year ago. No fever. 1/10 pain at this time. No GU sx. No diarrhea. Patient feels most comfortable while at rest.   Past Medical History  Diagnosis Date  . Asthma     diagnosed in her teens-no meds  . PONV (postoperative nausea and vomiting)    Past Surgical History  Procedure Laterality Date  . Tuibal ligation      Had BTL  21 yes ago  . Tubal ligation    . Cholecystectomy  03/10/2011    Procedure: LAPAROSCOPIC CHOLECYSTECTOMY;  Surgeon: Emelia LoronMatthew Wakefield, MD;  Location: [facility] OR;  Service: General;  Laterality: N/A;   Family History  Problem Relation Age of Onset  . Arthritis Mother   . Coronary artery disease Mother   . Hypertension Father   . Diabetes Father   . Coronary artery disease Father   . Arrhythmia Maternal Grandmother   . Cancer Maternal Grandfather    History  Substance Use Topics  . Smoking status: Current Every Day Smoker -- 0.25 packs/day    Types: Cigarettes  . Smokeless tobacco: Not on file  . Alcohol Use: Yes     Comment: on occasions   OB History   Grav Para Term Preterm Abortions TAB SAB Ect Mult Living                 Review of Systems  Ten point review of symptoms performed and is negative with the exception of symptoms noted above.    Allergies  Review of patient's allergies indicates no known allergies.  Home Medications   Prior to Admission  medications   Medication Sig Start Date End Date Taking? Authorizing Provider  acetaminophen (TYLENOL) 500 MG tablet Take 500-1,000 mg by mouth every 6 (six) hours as needed. For headache/pain    Yes Historical Provider, MD   BP 116/66  Pulse 67  Temp(Src) 98.2 F (36.8 C) (Oral)  Resp 20  Ht 5\' 8"  (1.727 m)  Wt 210 lb (95.255 kg)  BMI 31.94 kg/m2  SpO2 100% Physical Exam Gen: well developed and well nourished appearing Head: NCAT Eyes: PERL, EOMI Nose: no epistaixis or rhinorrhea Mouth/throat: mucosa is moist and pink, edentulous Neck: supple, no stridor Lungs: CTA B, no wheezing, rhonchi or rales CV: RRR, no murmur, extremities appear well perfused.  Abd: Obese soft, notender, nondistended. Unable to elicit tenderness on palpation of the right upper quadrant or right lower quadrant. Back: no ttp, no cva ttp Skin: warm and dry Ext: normal to inspection, no dependent edema Neuro: CN ii-xii grossly intact, no focal deficits Psyche; normal affect,  calm and cooperative.   ED Course  Procedures (including critical care time) Labs Review  Results for orders placed during the hospital encounter of 08/09/13 (from the past 24 hour(s))  CBC WITH DIFFERENTIAL     Status: Abnormal   Collection Time    08/09/13  7:04 PM  Result Value Ref Range   WBC 11.9 (*) 4.0 - 10.5 K/uL   RBC 4.39  3.87 - 5.11 MIL/uL   Hemoglobin 10.7 (*) 12.0 - 15.0 g/dL   HCT 09.8 (*) 11.9 - 14.7 %   MCV 76.5 (*) 78.0 - 100.0 fL   MCH 24.4 (*) 26.0 - 34.0 pg   MCHC 31.8  30.0 - 36.0 g/dL   RDW 82.9 (*) 56.2 - 13.0 %   Platelets 360  150 - 400 K/uL   Neutrophils Relative % 68  43 - 77 %   Neutro Abs 8.1 (*) 1.7 - 7.7 K/uL   Lymphocytes Relative 24  12 - 46 %   Lymphs Abs 2.8  0.7 - 4.0 K/uL   Monocytes Relative 5  3 - 12 %   Monocytes Absolute 0.7  0.1 - 1.0 K/uL   Eosinophils Relative 3  0 - 5 %   Eosinophils Absolute 0.4  0.0 - 0.7 K/uL   Basophils Relative 0  0 - 1 %   Basophils Absolute 0.1   0.0 - 0.1 K/uL  COMPREHENSIVE METABOLIC PANEL     Status: Abnormal   Collection Time    08/09/13  7:04 PM      Result Value Ref Range   Sodium 139  137 - 147 mEq/L   Potassium 4.7  3.7 - 5.3 mEq/L   Chloride 100  96 - 112 mEq/L   CO2 21  19 - 32 mEq/L   Glucose, Bld 111 (*) 70 - 99 mg/dL   BUN 20  6 - 23 mg/dL   Creatinine, Ser 8.65  0.50 - 1.10 mg/dL   Calcium 9.5  8.4 - 78.4 mg/dL   Total Protein 8.8 (*) 6.0 - 8.3 g/dL   Albumin 4.3  3.5 - 5.2 g/dL   AST 27  0 - 37 U/L   ALT 10  0 - 35 U/L   Alkaline Phosphatase 89  39 - 117 U/L   Total Bilirubin 0.5  0.3 - 1.2 mg/dL   GFR calc non Af Amer 58 (*) >90 mL/min   GFR calc Af Amer 67 (*) >90 mL/min  URINALYSIS, ROUTINE W REFLEX MICROSCOPIC     Status: Abnormal   Collection Time    08/09/13  7:11 PM      Result Value Ref Range   Color, Urine AMBER (*) YELLOW   APPearance HAZY (*) CLEAR   Specific Gravity, Urine 1.027  1.005 - 1.030   pH 5.5  5.0 - 8.0   Glucose, UA NEGATIVE  NEGATIVE mg/dL   Hgb urine dipstick MODERATE (*) NEGATIVE   Bilirubin Urine SMALL (*) NEGATIVE   Ketones, ur 15 (*) NEGATIVE mg/dL   Protein, ur NEGATIVE  NEGATIVE mg/dL   Urobilinogen, UA 1.0  0.0 - 1.0 mg/dL   Nitrite NEGATIVE  NEGATIVE   Leukocytes, UA LARGE (*) NEGATIVE  URINE MICROSCOPIC-ADD ON     Status: Abnormal   Collection Time    08/09/13  7:11 PM      Result Value Ref Range   Squamous Epithelial / LPF MANY (*) RARE   WBC, UA 11-20  <3 WBC/hpf   RBC / HPF 3-6  <3 RBC/hpf   Bacteria, UA MANY (*) RARE   Casts HYALINE CASTS (*) NEGATIVE    EKG: nsr, no acute ischemic changes, normal intervals, normal axis, normal qrs complex  CHEST 2 VIEW  COMPARISON: Chest radiograph performed 02/03/2006  FINDINGS:  The lungs are well-aerated. Mild medial right  basilar opacity could  simply reflect atelectasis or possibly mild pneumonia, and is only  minimally more prominent than on prior studies. There is no evidence  of pleural effusion or  pneumothorax.  The heart is normal in size; the mediastinal contour is within  normal limits. No acute osseous abnormalities are seen.  IMPRESSION:  Mild medial right basilar airspace opacity could simply reflect  atelectasis or possibly mild pneumonia, and is only minimally more  prominent than on prior studies. An underlying nodule is considered  less likely.  Electronically Signed  By: Roanna RaiderJeffery Chang M.D.  On: 08/10/2013 01:50    MDM   DDX: gastritis, PUD, GERD, pancreatitis, gallbladder disease, SBO, colitis, UTI, enteritis.   Patient feeling better after IVF. Work up is notable for possible early RLL pneumonia and leukocytosis. LFTs wnl. Abdominal exam benign. Suspect that constellation of sx is due to pneumonia and will tx accordingly. Plan to treat as outpatient. First dose of abx in ED.   Brandt LoosenJulie Aayush Gelpi, MD 08/10/13 [id number]  Brandt LoosenJulie Rochell Mabie, MD 08/10/13 [phone]

## 2013-08-09 NOTE — ED Notes (Signed)
She was on a walk outside and she got hot then started to haVE pain and cramps in her abdomen. States it feels like when she had gallbladder pain but they removed her gallbladder last year. Her symptoms have improved since onset.

## 2013-08-09 NOTE — ED Notes (Addendum)
Pt alert, NAD, calm, interactive, skin W&D, resps e/u, speaking in clear complete sentences, "feels better", c/o R mid abd pain (denies: sob, dizziness, vd, fever, bleeding, back pain, pelvic pain, urinary sx, vaginal sx), admits to some nausea with pain intensification. Results reviewed. Last ate this am. Last BM last night (normal). No meds PTA.

## 2013-08-10 ENCOUNTER — Emergency Department (HOSPITAL_COMMUNITY): Payer: Self-pay

## 2013-08-10 LAB — URINALYSIS, ROUTINE W REFLEX MICROSCOPIC
Bilirubin Urine: NEGATIVE
Glucose, UA: NEGATIVE mg/dL
Ketones, ur: NEGATIVE mg/dL
Nitrite: NEGATIVE
Protein, ur: NEGATIVE mg/dL
Specific Gravity, Urine: 1.022 (ref 1.005–1.030)
Urobilinogen, UA: 1 mg/dL (ref 0.0–1.0)
pH: 5.5 (ref 5.0–8.0)

## 2013-08-10 LAB — URINE MICROSCOPIC-ADD ON

## 2013-08-10 LAB — TROPONIN I: Troponin I: <0.3 ng/mL (ref <0.30)

## 2013-08-10 MED ORDER — AZITHROMYCIN 1 G PO PACK
1.0000 g | PACK | Freq: Once | ORAL | Status: DC
  Filled 2013-08-10: qty 1

## 2013-08-10 MED ORDER — AZITHROMYCIN 250 MG PO TABS: 250.0000 mg | ORAL_TABLET | Freq: Every day | ORAL | Status: DC

## 2013-08-10 MED ORDER — AZITHROMYCIN 250 MG PO TABS
500.0000 mg | ORAL_TABLET | Freq: Once | ORAL | Status: AC
  Administered 2013-08-10: 500 mg via ORAL
  Filled 2013-08-10: qty 2

## 2013-08-10 NOTE — ED Notes (Signed)
Pt to xray, alert, NAD, calm.

## 2013-08-10 NOTE — ED Notes (Signed)
Back from xray, ambulatory to b/r.

## 2013-08-10 NOTE — ED Notes (Signed)
Pt in xray, not in room.

## 2013-08-10 NOTE — ED Notes (Signed)
2nd urine sample sent to lab, pending results. Dr. Lavella LemonsManly in to speak with pt.

## 2013-11-03 ENCOUNTER — Inpatient Hospital Stay: Payer: Self-pay | Admitting: Internal Medicine

## 2013-11-03 LAB — TROPONIN I
Troponin-I: 0.04 ng/mL
Troponin-I: 0.04 ng/mL

## 2013-11-03 LAB — CBC
HCT: 35.7 % (ref 35.0–47.0)
HGB: 11.1 g/dL — ABNORMAL LOW (ref 12.0–16.0)
MCH: 24.4 pg — ABNORMAL LOW (ref 26.0–34.0)
MCHC: 31.1 g/dL — ABNORMAL LOW (ref 32.0–36.0)
MCV: 79 fL — ABNORMAL LOW (ref 80–100)
Platelet: 310 10*3/uL (ref 150–440)
RBC: 4.55 10*6/uL (ref 3.80–5.20)
RDW: 16.7 % — ABNORMAL HIGH (ref 11.5–14.5)
WBC: 13.3 10*3/uL — ABNORMAL HIGH (ref 3.6–11.0)

## 2013-11-03 LAB — COMPREHENSIVE METABOLIC PANEL
Albumin: 3.9 g/dL (ref 3.4–5.0)
Alkaline Phosphatase: 93 U/L
Anion Gap: 8 (ref 7–16)
BUN: 12 mg/dL (ref 7–18)
Bilirubin,Total: 0.7 mg/dL (ref 0.2–1.0)
Calcium, Total: 8.8 mg/dL (ref 8.5–10.1)
Chloride: 108 mmol/L — ABNORMAL HIGH (ref 98–107)
Co2: 22 mmol/L (ref 21–32)
Creatinine: 1.73 mg/dL — ABNORMAL HIGH (ref 0.60–1.30)
EGFR (African American): 39 — ABNORMAL LOW
EGFR (Non-African Amer.): 34 — ABNORMAL LOW
Glucose: 104 mg/dL — ABNORMAL HIGH (ref 65–99)
Osmolality: 276 (ref 275–301)
Potassium: 4 mmol/L (ref 3.5–5.1)
SGOT(AST): 42 U/L — ABNORMAL HIGH (ref 15–37)
SGPT (ALT): 21 U/L
Sodium: 138 mmol/L (ref 136–145)
Total Protein: 8 g/dL (ref 6.4–8.2)

## 2013-11-03 LAB — LIPID PANEL
Cholesterol: 164 mg/dL (ref 0–200)
HDL Cholesterol: 31 mg/dL — ABNORMAL LOW (ref 40–60)
Ldl Cholesterol, Calc: 105 mg/dL — ABNORMAL HIGH (ref 0–100)
Triglycerides: 140 mg/dL (ref 0–200)
VLDL Cholesterol, Calc: 28 mg/dL (ref 5–40)

## 2013-11-03 LAB — URINALYSIS, COMPLETE
Bilirubin,UR: NEGATIVE
Glucose,UR: NEGATIVE mg/dL (ref 0–75)
Hyaline Cast: 20
Nitrite: NEGATIVE
Ph: 5 (ref 4.5–8.0)
Protein: NEGATIVE
RBC,UR: 3 /HPF (ref 0–5)
Specific Gravity: 1.011 (ref 1.003–1.030)
Squamous Epithelial: 2
WBC UR: 7 /HPF (ref 0–5)

## 2013-11-03 LAB — T4, FREE: Free Thyroxine: 0.89 ng/dL (ref 0.76–1.46)

## 2013-11-03 LAB — LIPASE, BLOOD
Lipase: 501 U/L — ABNORMAL HIGH (ref 73–393)
Lipase: 1199 U/L — ABNORMAL HIGH (ref 73–393)

## 2013-11-03 LAB — CK TOTAL AND CKMB (NOT AT ARMC)
CK, Total: 174 U/L
CK-MB: 1.5 ng/mL (ref 0.5–3.6)

## 2013-11-03 LAB — TSH: Thyroid Stimulating Horm: 18.7 u[IU]/mL — ABNORMAL HIGH

## 2013-11-04 LAB — CBC WITH DIFFERENTIAL/PLATELET
Basophil #: 0.1 10*3/uL (ref 0.0–0.1)
Basophil %: 1.2 %
Eosinophil #: 0.5 10*3/uL (ref 0.0–0.7)
Eosinophil %: 5.8 %
HCT: 28.3 % — ABNORMAL LOW (ref 35.0–47.0)
HGB: 9.1 g/dL — ABNORMAL LOW (ref 12.0–16.0)
Lymphocyte #: 1.8 10*3/uL (ref 1.0–3.6)
Lymphocyte %: 23.3 %
MCH: 25.3 pg — ABNORMAL LOW (ref 26.0–34.0)
MCHC: 32.3 g/dL (ref 32.0–36.0)
MCV: 78 fL — ABNORMAL LOW (ref 80–100)
Monocyte #: 0.5 x10 3/mm (ref 0.2–0.9)
Monocyte %: 6.9 %
Neutrophil #: 4.9 10*3/uL (ref 1.4–6.5)
Neutrophil %: 62.8 %
Platelet: 237 10*3/uL (ref 150–440)
RBC: 3.61 10*6/uL — ABNORMAL LOW (ref 3.80–5.20)
RDW: 16.5 % — ABNORMAL HIGH (ref 11.5–14.5)
WBC: 7.8 10*3/uL (ref 3.6–11.0)

## 2013-11-04 LAB — BASIC METABOLIC PANEL
Anion Gap: 10 (ref 7–16)
BUN: 12 mg/dL (ref 7–18)
Calcium, Total: 7.6 mg/dL — ABNORMAL LOW (ref 8.5–10.1)
Chloride: 110 mmol/L — ABNORMAL HIGH (ref 98–107)
Co2: 22 mmol/L (ref 21–32)
Creatinine: 1.28 mg/dL (ref 0.60–1.30)
EGFR (African American): 56 — ABNORMAL LOW
EGFR (Non-African Amer.): 48 — ABNORMAL LOW
Glucose: 103 mg/dL — ABNORMAL HIGH (ref 65–99)
Osmolality: 283 (ref 275–301)
Potassium: 3.9 mmol/L (ref 3.5–5.1)
Sodium: 142 mmol/L (ref 136–145)

## 2013-11-04 LAB — LIPASE, BLOOD: Lipase: 228 U/L (ref 73–393)

## 2014-02-24 ENCOUNTER — Emergency Department: Payer: Self-pay | Admitting: Emergency Medicine

## 2014-06-21 NOTE — Discharge Summary (Signed)
PATIENT NAME:  Angelica Frank, Yun MR#:  [id number] DATE OF BIRTH:  [birth year]  PRIMARY CARE PHYSICIAN: Nonlocal.   DISCHARGE DIAGNOSES: 1.  Acute pancreatitis.  2.  Acute renal failure.  3.  Hypothyroidism.  4.  Anemia.  5.  Obesity.   CONDITION: Stable.   CODE STATUS: Full code.   HOME MEDICATIONS: Levothyroxine 25 mcg p.o. daily.   DIET: Low-fat, low-cholesterol diet.   ACTIVITY: As tolerated.   FOLLOWUP CARE: Follow up with PCP within 1-2 weeks.   REASON FOR ADMISSION: Right upper quadrant abdominal pain and near syncope.   HOSPITAL COURSE: The patient is a 54 year old Caucasian female with no significant past history who presented to the ED with right upper quadrant abdominal pain which was also pleuritic in nature. The patient felt dehydrated and almost passed out. Chest x-ray did not show any pneumonia. CAT scan of abdomen and chest did not show any abnormal findings. Her lipase was elevated, about 1000. The patient denied drinking alcohol.   For detailed history and physical examination, please refer to the admission note dictated by Dr. Nemiah CommanderKalisetti.  LABORATORY DATA: On admission date showed electrolytes were normal, BUN 12, creatinine 1.73, glucose 104. AST 42, ALT 21, total bilirubin 0.7.   1.  Acute pancreatitis: After admission, the patient was kept n.p.o., pain control and Zofran p.r.n. with IV fluid support. The patient's lipase decreased to a normal range.  2.  Acute renal failure after IV fluid support improved.  3.  Acute bronchitis symptoms. Chest x-ray did not show any abnormality. The patient denies any symptoms of shortness of breath and cough.  4.  Elevated TSH, possible hypothyroidism: The patient is treated with thyroxine. We will continue.   The patient has no complaints. Vital signs are stable. She is clinically stable and will be discharged home today. Discussed the patient's discharge plan with the patient and the patient's fiance, nurse and case manager.    TIME SPENT: About 36 minutes.    ____________________________ Shaune PollackQing Pau Banh, MD qc:ts D: 11/04/2013 13:29:10 ET T: 11/04/2013 18:17:28 ET JOB#: [id number]  cc: Shaune PollackQing Nilam Quakenbush, MD, <Dictator> Shaune PollackQING Amaryllis Malmquist MD ELECTRONICALLY SIGNED 11/08/2013 12:45

## 2014-06-21 NOTE — H&P (Signed)
PATIENT NAME:  Angelica Frank, Angelica Frank MR#:  [id number] DATE OF BIRTH:  1960/04/24  DATE OF ADMISSION:  11/03/2013  ADMITTING PHYSICIAN: Enid Baasadhika Clayvon Parlett, MD   PRIMARY MEDICAL DOCTOR: None.   CHIEF COMPLAINT: Right upper quadrant abdominal pain and near syncope.   HISTORY OF PRESENT ILLNESS: Angelica Frank is a 54 year old Caucasian female with no significant past medical history who was participating in a walk and walked almost 2-1/2 miles with her fianc, after which suddenly she developed right upper quadrant abdominal pain which was also pleuritic in nature. She became short of breath, could not walk any further. They were out of water. She felt dehydrated and almost passed out. She was brought over to the ER. Denies any chest pain. No nausea or vomiting. She has occasionally been having this right upper quadrant catchy abdominal pain for several days now. She has been having this right upper quadrant catchy abdominal pain for a few days now. She had her gallbladder taken several years ago. Not associated with any nausea, vomiting, fever, or chills. She has been having nasal congestion and cough for the last few days, subjective chills, and a lower temperature today. Chest x-ray has not been done yet. CT of the chest is pending. Her lipase is elevated at about 1000. She does not drink any alcohol. No prior history of pancreatitis. So she is being admitted for the same.   PAST MEDICAL HISTORY: None.   PAST SURGICAL HISTORY: 1.  Cholecystectomy.  2.  Tubal ligation.   ALLERGIES TO MEDICATION: No known drug allergies.  CURRENT HOME MEDICATIONS: Tylenol as needed.   SOCIAL HISTORY: Lives at cousin's home with her fianc. Smokes occasionally; a few cigarettes once in a while. No alcohol use.   FAMILY HISTORY: Mom with arthritis. Dad with heart disease, diabetes, and hypertension.   REVIEW OF SYSTEMS:  CONSTITUTIONAL: No fever, fatigue, or weakness.  EYES: No blurred vision, double vision, inflammation,  or glaucoma.  ENT: No tinnitus, ear pain, hearing loss, epistaxis, or discharge.  RESPIRATORY: No cough, wheeze, hemoptysis, COPD.  CARDIOVASCULAR: Pleuritic right lower chest pain. No orthopnea, edema, arrhythmia, palpitations, or syncope.  GASTROINTESTINAL: No nausea, vomiting, diarrhea. Positive for abdominal pain. No hematemesis or melena.  GENITOURINARY: No dysuria, hematuria, renal calculus, frequency, or incontinence.  ENDOCRINE: No polyuria, nocturia, thyroid problems, heat or cold intolerance.  HEMATOLOGY: No anemia, easy bruising, or bleeding.  SKIN: No acne, rash, or lesions.  MUSCULOSKELETAL: No neck fracture, pain, arthritis, or gout.  NEUROLOGIC: No numbness, weakness, CVA, TIA, or seizures.  PSYCHOLOGICAL: No anxiety, insomnia, depression.   PHYSICAL EXAMINATION:  VITAL SIGNS: Temperature 98.3 degrees Fahrenheit, pulse 85, respirations 22, blood pressure 113/68, pulse oximetry 100% on room air.  GENERAL: Well-built, well-nourished female sitting in bed, not in any acute distress.  HEENT: Normocephalic, atraumatic. Pupils equal, round, reacting to light. Anicteric sclerae. Extraocular movements intact. Oropharynx clear without erythema, mass, or exudates.  NECK: Supple. No thyromegaly, JVD, or carotid bruits. No lymphadenopathy.  LUNGS: Moving air bilaterally. No wheeze or crackles. No use of accessory muscles for breathing.  CARDIOVASCULAR: S1, S2, regular rate and rhythm. No murmurs, rubs, or gallops.  ABDOMEN: Soft, nontender, nondistended. No hepatosplenomegaly. Normal bowel sounds on abdominal exam. Mild discomfort. EXTREMITIES: No pedal edema. No clubbing or cyanosis. 2+ dorsalis pulses palpable bilaterally.  SKIN: No acne, rash, or lesions.  LYMPHATICS: No cervical or inguinal lymphadenopathy.  NEUROLOGIC: Cranial nerves intact. No focal motor or sensory deficits.  PSYCHOLOGIC: The patient is awake, alert, oriented x 3.  LABORATORY DATA: WBC is 13.3, hemoglobin 11.1,  hematocrit 35.7, platelet count 310,000.   Sodium 138, potassium 4.0, chloride 108, bicarbonate 22, BUN 12, creatinine 1.73, glucose 104, and calcium of 8.8.   ALT 21, AST 42, alkaline phosphatase 93, total bilirubin of 0.7, albumin of 3.9. TSH 18.7. Urinalysis negative for infection. Troponin 0.04. Lipase is 1199. EKG showing normal sinus rhythm, heart rate of 79. No acute ST-T wave abnormalities.   ASSESSMENT AND PLAN: A 54 year old female with no significant past medical history, admitted for right upper quadrant abdominal pain and elevated lipase.  1.  Acute pancreatitis. The patient is already status post cholecystectomy, has right upper quadrant pain, but it could also be pleuritic pain or musculoskeletal pain, as it started after walking for greater than 2.5 miles today, and she was dehydrated, too. We will do intravenous fluids, keep her n.p.o. A CT of the abdomen and pelvis is ordered and pending. Check a lipid profile to rule out hypertriglyceridemia with elevated lipase. Chest x-ray has been ordered to rule out any left lower lobe infiltrates causing pneumonia, and the patient also has some cough and bronchitis symptoms. Further changes in management after her CT.  2.  Acute bronchitis symptoms. Chest x-ray has been ordered. Z-Pak started.  3.  Elevated TSH, 2 up to 18, so she probably has some underlying hypothyroidism. Start Synthroid. Check a free T4.  4.  Acute renal failure with normal BUN. Could be from dehydration. Intravenous fluids. Recheck in a.m.  5.  Deep venous thrombosis prophylaxis.  CODE STATUS: Full code.  TIME SPENT ON ADMISSION: 50 minutes.    ____________________________ Enid Baas, MD rk:ST D: 11/03/2013 21:11:47 ET T: 11/03/2013 21:56:38 ET JOB#: [id number]  cc: Enid Baas, MD, <Dictator> Enid Baas MD ELECTRONICALLY SIGNED 11/04/2013 17:38

## 2015-10-20 ENCOUNTER — Other Ambulatory Visit: Payer: Self-pay | Admitting: Obstetrics & Gynecology

## 2015-10-20 DIAGNOSIS — Z1231 Encounter for screening mammogram for malignant neoplasm of breast: Secondary | ICD-10-CM

## 2015-11-13 ENCOUNTER — Ambulatory Visit: Payer: Self-pay | Attending: Obstetrics & Gynecology

## 2017-07-07 ENCOUNTER — Emergency Department (HOSPITAL_COMMUNITY): Payer: Self-pay

## 2017-07-07 ENCOUNTER — Other Ambulatory Visit: Payer: Self-pay

## 2017-07-07 ENCOUNTER — Emergency Department (HOSPITAL_COMMUNITY)
Admission: EM | Admit: 2017-07-07 | Discharge: 2017-07-08 | Disposition: A | Discharge status: Home / Self Care | Payer: Self-pay | Attending: Emergency Medicine | Admitting: Emergency Medicine

## 2017-07-07 ENCOUNTER — Encounter (HOSPITAL_COMMUNITY): Payer: Self-pay | Admitting: *Deleted

## 2017-07-07 DIAGNOSIS — R5383 Other fatigue: Secondary | ICD-10-CM | POA: Insufficient documentation

## 2017-07-07 DIAGNOSIS — J45909 Unspecified asthma, uncomplicated: Secondary | ICD-10-CM | POA: Insufficient documentation

## 2017-07-07 DIAGNOSIS — J3489 Other specified disorders of nose and nasal sinuses: Secondary | ICD-10-CM | POA: Insufficient documentation

## 2017-07-07 DIAGNOSIS — R072 Precordial pain: Secondary | ICD-10-CM

## 2017-07-07 DIAGNOSIS — R0981 Nasal congestion: Secondary | ICD-10-CM | POA: Insufficient documentation

## 2017-07-07 DIAGNOSIS — R05 Cough: Secondary | ICD-10-CM | POA: Insufficient documentation

## 2017-07-07 DIAGNOSIS — R059 Cough, unspecified: Secondary | ICD-10-CM

## 2017-07-07 DIAGNOSIS — R0602 Shortness of breath: Secondary | ICD-10-CM | POA: Insufficient documentation

## 2017-07-07 DIAGNOSIS — F17210 Nicotine dependence, cigarettes, uncomplicated: Secondary | ICD-10-CM | POA: Insufficient documentation

## 2017-07-07 LAB — CBC WITH DIFFERENTIAL/PLATELET
Basophils Absolute: 0.1 10*3/uL (ref 0.0–0.1)
Basophils Relative: 1 %
Eosinophils Absolute: 0.7 10*3/uL (ref 0.0–0.7)
Eosinophils Relative: 7 %
HCT: 35.1 % — ABNORMAL LOW (ref 36.0–46.0)
Hemoglobin: 11.6 g/dL — ABNORMAL LOW (ref 12.0–15.0)
Lymphocytes Relative: 29 %
Lymphs Abs: 3 10*3/uL (ref 0.7–4.0)
MCH: 29.7 pg (ref 26.0–34.0)
MCHC: 33 g/dL (ref 30.0–36.0)
MCV: 90 fL (ref 78.0–100.0)
Monocytes Absolute: 0.5 10*3/uL (ref 0.1–1.0)
Monocytes Relative: 5 %
Neutro Abs: 6 10*3/uL (ref 1.7–7.7)
Neutrophils Relative %: 58 %
Platelets: ADEQUATE 10*3/uL (ref 150–400)
RBC: 3.9 MIL/uL (ref 3.87–5.11)
RDW: 13.9 % (ref 11.5–15.5)
WBC: 10.6 10*3/uL — ABNORMAL HIGH (ref 4.0–10.5)

## 2017-07-07 LAB — D-DIMER, QUANTITATIVE: D-Dimer, Quant: 0.52 ug/mL-FEU — ABNORMAL HIGH (ref 0.00–0.50)

## 2017-07-07 LAB — COMPREHENSIVE METABOLIC PANEL
ALT: 13 U/L — ABNORMAL LOW (ref 14–54)
AST: 23 U/L (ref 15–41)
Albumin: 3.7 g/dL (ref 3.5–5.0)
Alkaline Phosphatase: 75 U/L (ref 38–126)
Anion gap: 8 (ref 5–15)
BUN: 14 mg/dL (ref 6–20)
CO2: 23 mmol/L (ref 22–32)
Calcium: 8.5 mg/dL — ABNORMAL LOW (ref 8.9–10.3)
Chloride: 110 mmol/L (ref 101–111)
Creatinine, Ser: 0.97 mg/dL (ref 0.44–1.00)
GFR calc Af Amer: >60 mL/min (ref >60)
GFR calc non Af Amer: >60 mL/min (ref >60)
Glucose, Bld: 101 mg/dL — ABNORMAL HIGH (ref 65–99)
Potassium: 3.6 mmol/L (ref 3.5–5.1)
Sodium: 141 mmol/L (ref 135–145)
Total Bilirubin: 0.6 mg/dL (ref 0.3–1.2)
Total Protein: 6.8 g/dL (ref 6.5–8.1)

## 2017-07-07 LAB — LIPASE, BLOOD: Lipase: 30 U/L (ref 11–51)

## 2017-07-07 LAB — TROPONIN I: Troponin I: <0.03 ng/mL (ref <0.03)

## 2017-07-07 NOTE — ED Triage Notes (Signed)
The pt is here visiting her brother  She lives in [place]   She has not felt well for 2-3 days chest congestion headache no temp  Productive  cough

## 2017-07-07 NOTE — ED Provider Notes (Signed)
[facility] EMERGENCY DEPARTMENT Provider Note   CSN: [id number] Arrival date & time: 07/07/17  1618     History   Chief Complaint Chief Complaint  Patient presents with  . Cough    HPI Smt. Angelica Frank is a 57 y.o. female.  The history is provided by the patient and medical records.  Chest Pain   This is a new problem. The current episode started more than 2 days ago. The problem occurs constantly. The problem has not changed since onset.The pain is associated with coughing and breathing. The pain is present in the lateral region and substernal region. The pain is moderate. The quality of the pain is described as sharp and stabbing. The pain does not radiate. Duration of episode(s) is 3 days. The symptoms are aggravated by deep breathing and certain positions. Associated symptoms include cough, malaise/fatigue and shortness of breath. Pertinent negatives include no abdominal pain, no back pain, no diaphoresis, no exertional chest pressure, no fever, no headaches, no lower extremity edema, no nausea, no palpitations, no sputum production, no syncope and no vomiting. She has tried nothing for the symptoms. The treatment provided no relief.    Past Medical History:  Diagnosis Date  . Asthma    diagnosed in her teens-no meds  . PONV (postoperative nausea and vomiting)     Patient Active Problem List   Diagnosis Date Noted  . Asthma-not on meds 03/09/2011  . Abdominal pain-Ddx Pancreatitis vs Chlocystits vs UTI 03/09/2011  . Obesity (BMI 30-39.9) 03/09/2011  . Leucocytosis 03/09/2011  . Cholecystitis 03/09/2011    Past Surgical History:  Procedure Laterality Date  . CHOLECYSTECTOMY  03/10/2011   Procedure: LAPAROSCOPIC CHOLECYSTECTOMY;  Surgeon: Emelia Loron, MD;  Location: [facility] OR;  Service: General;  Laterality: N/A;  . TUBAL LIGATION    . TUIBAL LIGATION     Had BTL  21 yes ago     OB History   None      Home Medications    Prior to Admission  medications   Medication Sig Start Date End Date Taking? Authorizing Provider  acetaminophen (TYLENOL) 500 MG tablet Take 500-1,000 mg by mouth every 6 (six) hours as needed. For headache/pain     [provider]  azithromycin (ZITHROMAX Z-PAK) 250 MG tablet Take 1 tablet (250 mg total) by mouth daily. 08/10/13   Brandt Loosen, MD    Family History Family History  Problem Relation Age of Onset  . Hypertension Father   . Diabetes Father   . Coronary artery disease Father   . Arthritis Mother   . Coronary artery disease Mother   . Arrhythmia Maternal Grandmother   . Cancer Maternal Grandfather     Social History Social History   Tobacco Use  . Smoking status: Current Every Day Smoker    Packs/day: 0.25    Types: Cigarettes  . Smokeless tobacco: Never Used  Substance Use Topics  . Alcohol use: Yes    Comment: on occasions  . Drug use: No     Allergies   Patient has no known allergies.   Review of Systems Review of Systems  Constitutional: Positive for fatigue and malaise/fatigue. Negative for chills, diaphoresis and fever.  HENT: Positive for congestion and rhinorrhea.   Eyes: Negative for visual disturbance.  Respiratory: Positive for cough, chest tightness and shortness of breath. Negative for sputum production, wheezing and stridor.   Cardiovascular: Positive for chest pain. Negative for palpitations and syncope.  Gastrointestinal: Negative for abdominal pain, constipation,  diarrhea, nausea and vomiting.  Genitourinary: Negative for dysuria, flank pain and frequency.  Musculoskeletal: Negative for back pain, neck pain and neck stiffness.  Skin: Negative for rash and wound.  Neurological: Negative for light-headedness and headaches.  Psychiatric/Behavioral: Negative for agitation.  All other systems reviewed and are negative.    Physical Exam Updated Vital Signs BP 127/72 (BP Location: Right Arm)   Pulse 81   Temp 98.3 F (36.8 C) (Oral)   Resp 18    Ht  (1.626 m)   Wt 90.7 kg (200 lb)   SpO2 96%   BMI 34.33 kg/m   Physical Exam  Constitutional: She is oriented to person, place, and time. She appears well-developed and well-nourished. No distress.  HENT:  Head: Normocephalic and atraumatic.  Mouth/Throat: Oropharynx is clear and moist. No oropharyngeal exudate.  Eyes: Pupils are equal, round, and reactive to light. Conjunctivae and EOM are normal.  Neck: Normal range of motion.  Cardiovascular: Normal rate and intact distal pulses.  No murmur heard. Pulmonary/Chest: Effort normal and breath sounds normal. No stridor. No respiratory distress. She has no wheezes. She has no rales. She exhibits tenderness.    Abdominal: Bowel sounds are normal. There is no tenderness.  Musculoskeletal: She exhibits no tenderness.  Neurological: She is alert and oriented to person, place, and time. No sensory deficit. She exhibits normal muscle tone.  Skin: Capillary refill takes less than 2 seconds. No rash noted. She is not diaphoretic. No erythema.  Psychiatric: She has a normal mood and affect.  Nursing note and vitals reviewed.    ED Treatments / Results  Labs (all labs ordered are listed, but only abnormal results are displayed) Labs Reviewed  CBC WITH DIFFERENTIAL/PLATELET - Abnormal; Notable for the following components:      Result Value   WBC 10.6 (*)    Hemoglobin 11.6 (*)    HCT 35.1 (*)    All other components within normal limits  COMPREHENSIVE METABOLIC PANEL - Abnormal; Notable for the following components:   Glucose, Bld 101 (*)    Calcium 8.5 (*)    ALT 13 (*)    All other components within normal limits  D-DIMER, QUANTITATIVE (NOT AT [facility]) - Abnormal; Notable for the following components:   D-Dimer, Quant 0.52 (*)    All other components within normal limits  LIPASE, BLOOD  TROPONIN I  URINALYSIS, ROUTINE W REFLEX MICROSCOPIC    EKG EKG Interpretation  Date/Time:  Friday Jul 07 2017 17:31:03  EDT Ventricular Rate:  83 PR Interval:  146 QRS Duration: 72 QT Interval:  380 QTC Calculation: 446 R Axis:   -51 Text Interpretation:  Normal sinus rhythm Left anterior fascicular block Cannot rule out Anterior infarct , age undetermined Abnormal ECG When compared to prior, no significant changes seen.  No STEMI Confirmed by Theda Belfast ([id number]) on 07/07/2017 9:00:02 PM   Radiology Dg Chest 2 View  Result Date: 07/07/2017 CLINICAL DATA:  Cough, congestion, LEFT-sided pain. EXAM: CHEST - 2 VIEW COMPARISON:  02/24/2014. FINDINGS: The heart size and mediastinal contours are within normal limits. Both lungs are clear. The visualized skeletal structures are unremarkable. IMPRESSION: No active cardiopulmonary disease. Electronically Signed   By: Elsie Stain M.D.   On: 07/07/2017 18:42    Procedures Procedures (including critical care time)  Medications Ordered in ED Medications - No data to display   Initial Impression / Assessment and Plan / ED Course  I have reviewed the triage vital signs and the  nursing notes.  Pertinent labs & imaging results that were available during my care of the patient were reviewed by me and considered in my medical decision making (see chart for details).     Linda Grimmer is a 57 y.o. female with past medical history today for asthma who presents with 3 days of cough, congestion, mild shortness of breath, chest pain.  Patient reports that she has had a nonproductive cough for the last few days as well as left-sided chest pain.  She describes it as moderate.  It is worse with coughing and deep breathing.  She denies any history of DVT or PE.  She denies any trauma.  She reports that she is had no fevers or chills but has had some rhinorrhea and congestion.  She denies any nausea or vomiting, diaphoresis, or exertional component of her symptoms.  She does report that she has had multiple sick contacts with URI.  On exam, chest is tender in the left lateral  chest.  Lungs are clear no murmurs are appreciated.  Back was nontender.  Abdomen nontender.  No significant edema or leg tenderness was seen.    EKG showed no STEMI.    Patient work-up showing no evidence of pneumonia.  Initial troponin was negative.  Laboratory testing otherwise only significant for a mildly elevated d-dimer of 0.52.    Here to see him in conversation was held with patient as to if she needs a PE study.  Patient reports that she was feeling better and did not want the CT scan at this time.  With age adjustment, the results is technically negative for her and patient agreed that she did not need the scan tonight.  Patient likely has muscular skeletal pain given the muscle tenderness on exam.  She likely injured herself while coughing.  Suspect viral URI given the known exposures and her cough without any evidence of pneumonia on x-ray.    Patient understands extremity strict return precautions and follow-up instructions.  Patient no reports or concerns and was discharged in good condition.                Final Clinical Impressions(s) / ED Diagnoses   Final diagnoses:  Cough  Precordial pain    ED Discharge Orders    None      Clinical Impression: 1. Cough   2. Precordial pain     Disposition: Discharge  Condition: Good  I have discussed the results, Dx and Tx plan with the pt(& family if present). He/she/they expressed understanding and agree(s) with the plan. Discharge instructions discussed at great length. Strict return precautions discussed and pt &/or family have verbalized understanding of the instructions. No further questions at time of discharge.    Discharge Medication List as of 07/08/2017 12:09 AM      Follow Up: [facility] [address] [place] [address] [phone] Schedule an appointment as soon as possible for a visit    [facility] EMERGENCY DEPARTMENT [address] [id number] mc [place] [address] [phone]       Torrence Hammack, Canary Brim, MD 07/08/17 [phone]

## 2017-07-08 NOTE — Discharge Instructions (Signed)
Your work-up today did not show evidence of pneumonia.  After our shared decision making conversation, we decided not to pursue the CT scan to definitively rule out pulmonary embolism however clinically we think you have muscular skeletal pain from your coughing.  Please follow-up with a primary care physician in the next several days for reassessment.  If any symptoms change or worsen, please return to the nearest emergency department.

## 2017-07-10 ENCOUNTER — Emergency Department (HOSPITAL_COMMUNITY)
Admission: EM | Admit: 2017-07-10 | Discharge: 2017-07-10 | Disposition: A | Discharge status: Home / Self Care | Payer: Self-pay | Attending: Emergency Medicine | Admitting: Emergency Medicine

## 2017-07-10 ENCOUNTER — Emergency Department (HOSPITAL_COMMUNITY): Payer: Self-pay

## 2017-07-10 ENCOUNTER — Encounter (HOSPITAL_COMMUNITY): Payer: Self-pay | Admitting: Emergency Medicine

## 2017-07-10 DIAGNOSIS — Z79899 Other long term (current) drug therapy: Secondary | ICD-10-CM | POA: Insufficient documentation

## 2017-07-10 DIAGNOSIS — F17210 Nicotine dependence, cigarettes, uncomplicated: Secondary | ICD-10-CM | POA: Insufficient documentation

## 2017-07-10 DIAGNOSIS — J40 Bronchitis, not specified as acute or chronic: Secondary | ICD-10-CM

## 2017-07-10 DIAGNOSIS — E041 Nontoxic single thyroid nodule: Secondary | ICD-10-CM | POA: Insufficient documentation

## 2017-07-10 DIAGNOSIS — J209 Acute bronchitis, unspecified: Secondary | ICD-10-CM | POA: Insufficient documentation

## 2017-07-10 DIAGNOSIS — J45909 Unspecified asthma, uncomplicated: Secondary | ICD-10-CM | POA: Insufficient documentation

## 2017-07-10 LAB — BASIC METABOLIC PANEL WITH GFR
Anion gap: 10 (ref 5–15)
BUN: 12 mg/dL (ref 6–20)
CO2: 21 mmol/L — ABNORMAL LOW (ref 22–32)
Calcium: 8.5 mg/dL — ABNORMAL LOW (ref 8.9–10.3)
Chloride: 110 mmol/L (ref 101–111)
Creatinine, Ser: 1 mg/dL (ref 0.44–1.00)
GFR calc Af Amer: >60 mL/min
GFR calc non Af Amer: >60 mL/min
Glucose, Bld: 115 mg/dL — ABNORMAL HIGH (ref 65–99)
Potassium: 3.8 mmol/L (ref 3.5–5.1)
Sodium: 141 mmol/L (ref 135–145)

## 2017-07-10 LAB — CBC
HCT: 36.8 % (ref 36.0–46.0)
Hemoglobin: 12.3 g/dL (ref 12.0–15.0)
MCH: 29.6 pg (ref 26.0–34.0)
MCHC: 33.4 g/dL (ref 30.0–36.0)
MCV: 88.5 fL (ref 78.0–100.0)
Platelets: 301 10*3/uL (ref 150–400)
RBC: 4.16 MIL/uL (ref 3.87–5.11)
RDW: 14 % (ref 11.5–15.5)
WBC: 9.3 10*3/uL (ref 4.0–10.5)

## 2017-07-10 LAB — I-STAT TROPONIN, ED: Troponin i, poc: 0 ng/mL (ref 0.00–0.08)

## 2017-07-10 MED ORDER — ALBUTEROL SULFATE (2.5 MG/3ML) 0.083% IN NEBU
5.0000 mg | INHALATION_SOLUTION | Freq: Once | RESPIRATORY_TRACT | Status: AC
  Administered 2017-07-10: 5 mg via RESPIRATORY_TRACT
  Filled 2017-07-10: qty 6

## 2017-07-10 MED ORDER — PREDNISONE 20 MG PO TABS
60.0000 mg | ORAL_TABLET | Freq: Once | ORAL | Status: AC
  Administered 2017-07-10: 60 mg via ORAL
  Filled 2017-07-10: qty 3

## 2017-07-10 MED ORDER — IOPAMIDOL (ISOVUE-370) INJECTION 76%
INTRAVENOUS | Status: AC
  Filled 2017-07-10: qty 100

## 2017-07-10 MED ORDER — IOPAMIDOL (ISOVUE-370) INJECTION 76%
100.0000 mL | Freq: Once | INTRAVENOUS | Status: AC | PRN
  Administered 2017-07-10: 100 mL via INTRAVENOUS

## 2017-07-10 MED ORDER — ALBUTEROL SULFATE HFA 108 (90 BASE) MCG/ACT IN AERS: 1.0000 | INHALATION_SPRAY | Freq: Four times a day (QID) | RESPIRATORY_TRACT | 0 refills | Status: AC | PRN

## 2017-07-10 MED ORDER — PREDNISONE 20 MG PO TABS: 60.0000 mg | ORAL_TABLET | Freq: Every day | ORAL | 0 refills | Status: DC

## 2017-07-10 NOTE — ED Triage Notes (Addendum)
Pt states seen for same 5/10. Pt states cough with yellow to clear mucous. Left sided chest pain with shortness of breath. Pain is 9/10. Pt was told to come back if symptoms got worse. Pt states the cough and shortness of breath is what has gotten worse. No wheezing noted. Pt has hx of asthma. Lung sounds are diminished.

## 2017-07-10 NOTE — ED Provider Notes (Addendum)
[facility] EMERGENCY DEPARTMENT Provider Note   CSN: [id number] Arrival date & time: 07/10/17  1244     History   Chief Complaint Chief Complaint  Patient presents with  . Chest Pain  . Cough  . Shortness of Breath    HPI Angelica Frank is a 57 y.o. female.  She is presenting to the ED with complaints of continued cough shortness of breath for about a month and a half.   she has left-sided chest pain under the left breast that occurs with her cough.  She states she is felt like she is coughing up along.  She denies any fever although has felt hot.  She has nasal congestion and seasonal allergies.  She states she has a history of asthma.  She is tried Aleve for this with some relief.  She was evaluated here on the 10th with an unremarkable work-up but it d-dimer of 0.53.  She declined CT at that time because her ride was leaving.  She is back here today saying she would like to get the CAT scan because her symptoms are no better.  There is been no hemoptysis.  No PE risk factors identified.  No leg pain no leg swelling.  The history is provided by the patient.  Shortness of Breath  This is a new problem. The problem has not changed since onset.Associated symptoms include coryza, rhinorrhea, cough, sputum production and chest pain. Pertinent negatives include no fever, no sore throat, no ear pain, no neck pain, no hemoptysis, no syncope, no vomiting, no abdominal pain, no rash, no leg pain and no leg swelling. The problem's precipitants include pollens. She has tried nothing for the symptoms. The treatment provided no relief. She has had prior ED visits. Associated medical issues include asthma.    Past Medical History:  Diagnosis Date  . Asthma    diagnosed in her teens-no meds  . PONV (postoperative nausea and vomiting)     Patient Active Problem List   Diagnosis Date Noted  . Asthma-not on meds 03/09/2011  . Abdominal pain-Ddx Pancreatitis vs Chlocystits vs UTI  03/09/2011  . Obesity (BMI 30-39.9) 03/09/2011  . Leucocytosis 03/09/2011  . Cholecystitis 03/09/2011    Past Surgical History:  Procedure Laterality Date  . CHOLECYSTECTOMY  03/10/2011   Procedure: LAPAROSCOPIC CHOLECYSTECTOMY;  Surgeon: Emelia Loron, MD;  Location: [facility] OR;  Service: General;  Laterality: N/A;  . TUBAL LIGATION    . TUIBAL LIGATION     Had BTL  21 yes ago     OB History   None      Home Medications    Prior to Admission medications   Medication Sig Start Date End Date Taking? Authorizing Provider  acetaminophen (TYLENOL) 500 MG tablet Take 500-1,000 mg by mouth every 6 (six) hours as needed. For headache/pain    Yes [provider]  diphenhydrAMINE (BENADRYL) 25 mg capsule Take 25 mg by mouth every 6 (six) hours as needed for allergies.   Yes [provider]  guaiFENesin (ROBITUSSIN) 100 MG/5ML SOLN Take 5 mLs by mouth every 4 (four) hours as needed for cough or to loosen phlegm.   Yes [provider]  azithromycin (ZITHROMAX Z-PAK) 250 MG tablet Take 1 tablet (250 mg total) by mouth daily. Patient not taking: Reported on 07/07/2017 08/10/13   Brandt Loosen, MD    Family History Family History  Problem Relation Age of Onset  . Hypertension Father   . Diabetes Father   .  Coronary artery disease Father   . Arthritis Mother   . Coronary artery disease Mother   . Arrhythmia Maternal Grandmother   . Cancer Maternal Grandfather     Social History Social History   Tobacco Use  . Smoking status: Current Every Day Smoker    Packs/day: 0.25    Types: Cigarettes  . Smokeless tobacco: Never Used  Substance Use Topics  . Alcohol use: Yes    Comment: on occasions  . Drug use: No     Allergies   Patient has no known allergies.   Review of Systems Review of Systems  Constitutional: Negative for chills and fever.  HENT: Positive for rhinorrhea. Negative for ear pain and sore throat.   Eyes: Negative for pain and visual  disturbance.  Respiratory: Positive for cough, sputum production and shortness of breath. Negative for hemoptysis.   Cardiovascular: Positive for chest pain. Negative for palpitations, leg swelling and syncope.  Gastrointestinal: Negative for abdominal pain and vomiting.  Genitourinary: Negative for dysuria and hematuria.  Musculoskeletal: Negative for arthralgias, back pain and neck pain.  Skin: Negative for color change and rash.  Neurological: Negative for seizures and syncope.  All other systems reviewed and are negative.    Physical Exam Updated Vital Signs BP 120/65 (BP Location: Left Arm)   Pulse 80   Temp 98.7 F (37.1 C) (Oral)   Resp 19   SpO2 99%   Physical Exam  Constitutional: She appears well-developed and well-nourished. No distress.  HENT:  Head: Normocephalic and atraumatic.  Eyes: Conjunctivae are normal.  Neck: Neck supple.  Cardiovascular: Normal rate and regular rhythm.  No murmur heard. Pulses:      Radial pulses are 2+ on the right side, and 2+ on the left side.  Pulmonary/Chest: Effort normal and breath sounds normal. No respiratory distress.  Abdominal: Soft. There is no tenderness.  Musculoskeletal: Normal range of motion. She exhibits no edema.       Right lower leg: Normal. She exhibits no tenderness and no edema.       Left lower leg: Normal. She exhibits no tenderness and no edema.  Neurological: She is alert.  Skin: Skin is warm and dry.  Psychiatric: She has a normal mood and affect.  Nursing note and vitals reviewed.    ED Treatments / Results  Labs (all labs ordered are listed, but only abnormal results are displayed) Labs Reviewed  BASIC METABOLIC PANEL - Abnormal; Notable for the following components:      Result Value   CO2 21 (*)    Glucose, Bld 115 (*)    Calcium 8.5 (*)    All other components within normal limits  CBC  I-STAT TROPONIN, ED    EKG EKG Interpretation  Date/Time:  Monday Jul 10 2017 12:54:45  EDT Ventricular Rate:  88 PR Interval:  162 QRS Duration: 74 QT Interval:  368 QTC Calculation: 445 R Axis:   -62 Text Interpretation:  Normal sinus rhythm Pulmonary disease pattern Left anterior fascicular block Cannot rule out Inferior infarct , age undetermined Abnormal ECG No STEMI.  Confirmed by Alona Bene [phone]) on 07/20/2017 9:10:48 AM   Radiology Dg Chest 2 View  Result Date: 07/10/2017 CLINICAL DATA:  Cough and shortness of breath.  Chest pain. EXAM: CHEST - 2 VIEW COMPARISON:  Jul 07, 2017 FINDINGS: Lungs are clear. Heart size and pulmonary vascularity are normal. No adenopathy. No pneumothorax. No bone lesions. IMPRESSION: No edema or consolidation. Electronically Signed   By: Chrissie Noa  Margarita Grizzle III M.D.   On: 07/10/2017 13:40   Ct Angio Chest Pe W/cm &/or Wo Cm  Result Date: 07/10/2017 CLINICAL DATA:  57 y/o F; chest pain and positive D-dimer. PE suspected, intermediate probability. EXAM: CT ANGIOGRAPHY CHEST WITH CONTRAST TECHNIQUE: Multidetector CT imaging of the chest was performed using the standard protocol during bolus administration of intravenous contrast. Multiplanar CT image reconstructions and MIPs were obtained to evaluate the vascular anatomy. CONTRAST:  100 cc Isovue 370 COMPARISON:  None. FINDINGS: Cardiovascular: Satisfactory opacification of the pulmonary arteries to the segmental level. No evidence of pulmonary embolism. Normal heart size. No pericardial effusion. Mediastinum/Nodes: 36 mm right lobe of thyroid nodule. No mediastinal or axillary lymphadenopathy. Mildly patulous esophagus with small hiatal hernia. Lungs/Pleura: Mild mosaic attenuation of the lungs greatest in the lung bases. No consolidation, effusion, or pneumothorax. Upper Abdomen: Cholecystectomy. Musculoskeletal: Chronic right anterior rib fractures. No acute fracture identified. Mild discogenic degenerative changes of the thoracic spine. Review of the MIP images confirms the above findings.  IMPRESSION: 1. No pulmonary embolus identified. 2. Mild mosaic attenuation of the lung bases, probably air trapping related to small airways disease. No consolidation. 3. 36 mm right lobe of thyroid nodule. Further evaluation with thyroid ultrasound is recommended on a nonemergent basis. Electronically Signed   By: Mitzi Hansen M.D.   On: 07/10/2017 23:28    Procedures Procedures (including critical care time)  Medications Ordered in ED Medications  albuterol (PROVENTIL) (2.5 MG/3ML) 0.083% nebulizer solution 5 mg (5 mg Nebulization Given 07/10/17 1315)     Initial Impression / Assessment and Plan / ED Course  I have reviewed the triage vital signs and the nursing notes.  Pertinent labs & imaging results that were available during my care of the patient were reviewed by me and considered in my medical decision making (see chart for details).  Clinical Course as of Jul 12 1131  Mon Jul 10, 2017  2011 Patient presenting again with cough chest pain shortness of breath.  She was offered a CT chest last visit so I do feel slightly obligated to complete that test today.  Clinically she looks like she has an upper respiratory infection and cough I do not hear any significant wheezing with her.  If her CT is negative I would put her on some steroids and have her continue using inhaler.  She has no PCP to follow-up with.   [MB]    Clinical Course User Index [MB] Terrilee Files, MD     Final Clinical Impressions(s) / ED Diagnoses   Final diagnoses:  Bronchitis  Thyroid nodule    ED Discharge Orders    None       Terrilee Files, MD 07/11/17 1133    Terrilee Files, MD 07/25/17 [phone]

## 2017-07-10 NOTE — Discharge Instructions (Addendum)
Your evaluated in the emergency department for chest pain and cough and shortness of breath.  Your CAT scan did not show any signs of blood clot.  We are going to start you on some steroids and an inhaler to see if that would help improve your symptoms.  You should follow-up with your doctor and return if any worsening symptoms.  You also had an incidental thyroid nodule noted on CAT scan that will need to be followed up.

## 2017-07-10 NOTE — ED Notes (Signed)
Patient transported to CT 

## 2017-07-10 NOTE — ED Notes (Signed)
Pt verbalizes understanding of d/c instructions. Pt received prescriptions. Pt ambulatory at d/c with all belongings.  

## 2018-11-12 ENCOUNTER — Emergency Department (HOSPITAL_COMMUNITY)
Admission: EM | Admit: 2018-11-12 | Discharge: 2018-11-12 | Disposition: A | Discharge status: Home / Self Care | Payer: Self-pay | Attending: Emergency Medicine | Admitting: Emergency Medicine

## 2018-11-12 ENCOUNTER — Encounter (HOSPITAL_COMMUNITY): Payer: Self-pay | Admitting: Emergency Medicine

## 2018-11-12 ENCOUNTER — Emergency Department (HOSPITAL_COMMUNITY): Payer: Self-pay

## 2018-11-12 DIAGNOSIS — M25552 Pain in left hip: Secondary | ICD-10-CM

## 2018-11-12 DIAGNOSIS — F17210 Nicotine dependence, cigarettes, uncomplicated: Secondary | ICD-10-CM | POA: Insufficient documentation

## 2018-11-12 DIAGNOSIS — M25562 Pain in left knee: Secondary | ICD-10-CM | POA: Insufficient documentation

## 2018-11-12 DIAGNOSIS — G8921 Chronic pain due to trauma: Secondary | ICD-10-CM | POA: Insufficient documentation

## 2018-11-12 MED ORDER — IBUPROFEN 400 MG PO TABS
600.0000 mg | ORAL_TABLET | Freq: Once | ORAL | Status: AC
  Administered 2018-11-12: 600 mg via ORAL
  Filled 2018-11-12: qty 1

## 2018-11-12 NOTE — ED Triage Notes (Signed)
Pt here with c/o left hip and knee pqain after being pulled over ea chair by her dog about 2 months ago pt has tried OTC meds without relief

## 2018-11-12 NOTE — Discharge Instructions (Addendum)
Please read attached information. If you experience any new or worsening signs or symptoms please return to the emergency room for evaluation. Please follow-up with your primary care provider or specialist as discussed.  °

## 2018-11-12 NOTE — ED Notes (Signed)
Pt verbalized understanding of discharge instructions. Opportunity for questions and answers were provided.

## 2018-11-12 NOTE — ED Provider Notes (Signed)
[facility] EMERGENCY DEPARTMENT Provider Note   CSN: [id number] Arrival date & time: 11/12/18  1337     History   Chief Complaint No chief complaint on file.   HPI Angelica Frank is a 58 y.o. female.     HPI   58 year old female presents with complaints of left back hip and knee pain.  Patient notes she was in a chair approximately 1 month ago when her dog pulled her causing her to land on her left hip.  She notes since that time she has had severe pain at the hip that radiates into the groin.  She notes some lower lumbar pain as well and pain at the lateral posterior knee.  She notes the pain is worse with range of motion, hip pain is worse with ambulation.  She denies any abdominal pain.  She notes alcohol baths and Tylenol have not improved her symptoms.  She notes she is able ambulate but does have an antalgic gait.  She notes the pain is worse when she adducts the left hip.  Past Medical History:  Diagnosis Date  . Asthma    diagnosed in her teens-no meds  . PONV (postoperative nausea and vomiting)     Patient Active Problem List   Diagnosis Date Noted  . Asthma-not on meds 03/09/2011  . Abdominal pain-Ddx Pancreatitis vs Chlocystits vs UTI 03/09/2011  . Obesity (BMI 30-39.9) 03/09/2011  . Leucocytosis 03/09/2011  . Cholecystitis 03/09/2011    Past Surgical History:  Procedure Laterality Date  . CHOLECYSTECTOMY  03/10/2011   Procedure: LAPAROSCOPIC CHOLECYSTECTOMY;  Surgeon: Emelia Loron, MD;  Location: [facility] OR;  Service: General;  Laterality: N/A;  . TUBAL LIGATION    . TUIBAL LIGATION     Had BTL  21 yes ago     OB History   No obstetric history on file.      Home Medications    Prior to Admission medications   Medication Sig Start Date End Date Taking? Authorizing Provider  acetaminophen (TYLENOL) 500 MG tablet Take 500-1,000 mg by mouth every 6 (six) hours as needed. For headache/pain     [provider]  albuterol  (PROVENTIL HFA;VENTOLIN HFA) 108 (90 Base) MCG/ACT inhaler Inhale 1-2 puffs into the lungs every 6 (six) hours as needed for wheezing or shortness of breath. 07/10/17   Terrilee Files, MD  azithromycin (ZITHROMAX Z-PAK) 250 MG tablet Take 1 tablet (250 mg total) by mouth daily. Patient not taking: Reported on 07/07/2017 08/10/13   Brandt Loosen, MD  diphenhydrAMINE (BENADRYL) 25 mg capsule Take 25 mg by mouth every 6 (six) hours as needed for allergies.    [provider]  guaiFENesin (ROBITUSSIN) 100 MG/5ML SOLN Take 5 mLs by mouth every 4 (four) hours as needed for cough or to loosen phlegm.    [provider]  predniSONE (DELTASONE) 20 MG tablet Take 3 tablets (60 mg total) by mouth daily. 07/10/17   Terrilee Files, MD    Family History Family History  Problem Relation Age of Onset  . Hypertension Father   . Diabetes Father   . Coronary artery disease Father   . Arthritis Mother   . Coronary artery disease Mother   . Arrhythmia Maternal Grandmother   . Cancer Maternal Grandfather     Social History Social History   Tobacco Use  . Smoking status: Current Every Day Smoker    Packs/day: 0.25    Types: Cigarettes  . Smokeless tobacco: Never Used  Substance Use Topics  . Alcohol use: Yes    Comment: on occasions  . Drug use: No     Allergies   Patient has no known allergies.   Review of Systems Review of Systems  All other systems reviewed and are negative.    Physical Exam Updated Vital Signs BP 129/64 (BP Location: Right Arm)   Pulse (!) 58   Temp 98.5 F (36.9 C) (Oral)   Resp 16   SpO2 100%   Physical Exam Vitals signs and nursing note reviewed.  Constitutional:      Appearance: She is well-developed.  HENT:     Head: Normocephalic and atraumatic.  Eyes:     General: No scleral icterus.       Right eye: No discharge.        Left eye: No discharge.     Conjunctiva/sclera: Conjunctivae normal.     Pupils: Pupils are equal, round, and  reactive to light.  Neck:     Musculoskeletal: Normal range of motion.     Vascular: No JVD.     Trachea: No tracheal deviation.  Pulmonary:     Effort: Pulmonary effort is normal.     Breath sounds: No stridor.  Abdominal:     Comments: Abdomen soft nontender  Musculoskeletal:     Comments: Tenderness palpation of the left lateral hip no obvious bruising, pain with range of motion including abduction and flexion at the hip, knee pain with flexion full active range of motion, tenderness to the anterior lateral aspect of the knee, no bruising to the hip or lower extremity sensation intact throughout  Neurological:     Mental Status: She is alert and oriented to person, place, and time.     Coordination: Coordination normal.  Psychiatric:        Behavior: Behavior normal.        Thought Content: Thought content normal.        Judgment: Judgment normal.      ED Treatments / Results  Labs (all labs ordered are listed, but only abnormal results are displayed) Labs Reviewed - No data to display  EKG None  Radiology Dg Lumbar Spine Complete  Result Date: 11/12/2018 CLINICAL DATA:  58 year old female with a fall EXAM: LUMBAR SPINE - COMPLETE 4+ VIEW COMPARISON:  None. FINDINGS: Lumbar Spine: Lumbar vertebral elements maintain normal alignment without evidence of anterolisthesis, retrolisthesis, subluxation. No acute fracture line identified. Vertebral body heights maintained. Disc space heights maintained with early endplate changes at Z6-X0, L4-L5. Facet hypertrophy at L4-L5 and L5-S1 with no significant foraminal narrowing. Cholecystectomy.  Unremarkable gas pattern. IMPRESSION: Negative for acute fracture or malalignment of the lumbar spine. Mild degenerative disc disease Electronically Signed   By: Corrie Mckusick D.O.   On: 11/12/2018 16:21   Dg Knee Complete 4 Views Left  Result Date: 11/12/2018 CLINICAL DATA:  58 year old female with a fall EXAM: LEFT KNEE - COMPLETE 4+ VIEW  COMPARISON:  None. FINDINGS: No acute displaced fracture. No joint effusion no significant degenerative changes. No radiopaque foreign body. IMPRESSION: Negative for acute bony abnormality Electronically Signed   By: Corrie Mckusick D.O.   On: 11/12/2018 16:19   Dg Hip Unilat W Or Wo Pelvis 2-3 Views Left  Result Date: 11/12/2018 CLINICAL DATA:  Fall with lateral hip pain EXAM: DG HIP (WITH OR WITHOUT PELVIS) 2-3V LEFT COMPARISON:  CT 11/04/2013 FINDINGS: SI joints are non widened. The pubic symphysis and rami are intact. Both femoral heads project in joint.  No fracture or malalignment. IMPRESSION: No acute osseous abnormality. Electronically Signed   By: Jasmine PangKim  Fujinaga M.D.   On: 11/12/2018 16:21    Procedures Procedures (including critical care time)  Medications Ordered in ED Medications  ibuprofen (ADVIL) tablet 600 mg (600 mg Oral Given 11/12/18 1638)     Initial Impression / Assessment and Plan / ED Course  I have reviewed the triage vital signs and the nursing notes.  Pertinent labs & imaging results that were available during my care of the patient were reviewed by me and considered in my medical decision making (see chart for details).        Assessment/Plan: 58 year old female presents today with likely musculoskeletal pain.  No signs of acute bony abnormality.  Discharged symptomatic care outpatient follow-up.  Patient verbalized understanding and agreement to today's plan had no further questions or concerns.    Final Clinical Impressions(s) / ED Diagnoses   Final diagnoses:  Pain of left hip joint  Acute pain of left knee    ED Discharge Orders    None       Eyvonne MechanicHedges, Ludmilla Mcgillis, PA-C 11/13/18 1019    Cathren LaineSteinl, Kevin, MD 11/13/18 1534

## 2018-11-12 NOTE — ED Notes (Signed)
Patient transported to X-ray 

## 2020-07-27 ENCOUNTER — Emergency Department (HOSPITAL_COMMUNITY): Payer: Self-pay

## 2020-07-27 ENCOUNTER — Emergency Department (HOSPITAL_COMMUNITY)
Admission: EM | Admit: 2020-07-27 | Discharge: 2020-07-27 | Disposition: A | Discharge status: Home / Self Care | Payer: Self-pay | Attending: Emergency Medicine | Admitting: Emergency Medicine

## 2020-07-27 ENCOUNTER — Other Ambulatory Visit: Payer: Self-pay

## 2020-07-27 DIAGNOSIS — R079 Chest pain, unspecified: Secondary | ICD-10-CM | POA: Insufficient documentation

## 2020-07-27 DIAGNOSIS — J45909 Unspecified asthma, uncomplicated: Secondary | ICD-10-CM | POA: Insufficient documentation

## 2020-07-27 DIAGNOSIS — F17210 Nicotine dependence, cigarettes, uncomplicated: Secondary | ICD-10-CM | POA: Insufficient documentation

## 2020-07-27 DIAGNOSIS — R0600 Dyspnea, unspecified: Secondary | ICD-10-CM | POA: Insufficient documentation

## 2020-07-27 LAB — CBC WITH DIFFERENTIAL/PLATELET
Abs Immature Granulocytes: 0.02 10*3/uL (ref 0.00–0.07)
Basophils Absolute: 0.1 10*3/uL (ref 0.0–0.1)
Basophils Relative: 1 %
Eosinophils Absolute: 0.6 10*3/uL — ABNORMAL HIGH (ref 0.0–0.5)
Eosinophils Relative: 7 %
HCT: 34.5 % — ABNORMAL LOW (ref 36.0–46.0)
Hemoglobin: 11.7 g/dL — ABNORMAL LOW (ref 12.0–15.0)
Immature Granulocytes: 0 %
Lymphocytes Relative: 37 %
Lymphs Abs: 3.4 10*3/uL (ref 0.7–4.0)
MCH: 32.6 pg (ref 26.0–34.0)
MCHC: 33.9 g/dL (ref 30.0–36.0)
MCV: 96.1 fL (ref 80.0–100.0)
Monocytes Absolute: 0.6 10*3/uL (ref 0.1–1.0)
Monocytes Relative: 7 %
Neutro Abs: 4.3 10*3/uL (ref 1.7–7.7)
Neutrophils Relative %: 48 %
Platelets: 283 10*3/uL (ref 150–400)
RBC: 3.59 MIL/uL — ABNORMAL LOW (ref 3.87–5.11)
RDW: 12.8 % (ref 11.5–15.5)
WBC: 9.1 10*3/uL (ref 4.0–10.5)
nRBC: 0 % (ref 0.0–0.2)

## 2020-07-27 LAB — COMPREHENSIVE METABOLIC PANEL
ALT: 14 U/L (ref 0–44)
AST: 37 U/L (ref 15–41)
Albumin: 3.5 g/dL (ref 3.5–5.0)
Alkaline Phosphatase: 63 U/L (ref 38–126)
Anion gap: 9 (ref 5–15)
BUN: 14 mg/dL (ref 6–20)
CO2: 20 mmol/L — ABNORMAL LOW (ref 22–32)
Calcium: 8.4 mg/dL — ABNORMAL LOW (ref 8.9–10.3)
Chloride: 112 mmol/L — ABNORMAL HIGH (ref 98–111)
Creatinine, Ser: 1.04 mg/dL — ABNORMAL HIGH (ref 0.44–1.00)
GFR, Estimated: >60 mL/min (ref >60)
Glucose, Bld: 102 mg/dL — ABNORMAL HIGH (ref 70–99)
Potassium: 3.2 mmol/L — ABNORMAL LOW (ref 3.5–5.1)
Sodium: 141 mmol/L (ref 135–145)
Total Bilirubin: 0.9 mg/dL (ref 0.3–1.2)
Total Protein: 6.6 g/dL (ref 6.5–8.1)

## 2020-07-27 LAB — TROPONIN I (HIGH SENSITIVITY)
Troponin I (High Sensitivity): 3 ng/L (ref <18)
Troponin I (High Sensitivity): 4 ng/L (ref <18)

## 2020-07-27 MED ORDER — NITROGLYCERIN 0.4 MG SL SUBL
0.4000 mg | SUBLINGUAL_TABLET | SUBLINGUAL | Status: DC | PRN
  Administered 2020-07-27: 0.4 mg via SUBLINGUAL
  Filled 2020-07-27: qty 1

## 2020-07-27 MED ORDER — PANTOPRAZOLE SODIUM 20 MG PO TBEC: 40.0000 mg | DELAYED_RELEASE_TABLET | Freq: Every day | ORAL | 0 refills | Status: AC

## 2020-07-27 MED ORDER — POTASSIUM CHLORIDE CRYS ER 20 MEQ PO TBCR
40.0000 meq | EXTENDED_RELEASE_TABLET | Freq: Once | ORAL | Status: AC
  Administered 2020-07-27: 40 meq via ORAL
  Filled 2020-07-27: qty 2

## 2020-07-27 MED ORDER — KETOROLAC TROMETHAMINE 30 MG/ML IJ SOLN
30.0000 mg | Freq: Once | INTRAMUSCULAR | Status: AC
  Administered 2020-07-27: 30 mg via INTRAVENOUS
  Filled 2020-07-27: qty 1

## 2020-07-27 MED ORDER — CYCLOBENZAPRINE HCL 10 MG PO TABS
10.0000 mg | ORAL_TABLET | Freq: Once | ORAL | Status: AC
  Administered 2020-07-27: 10 mg via ORAL
  Filled 2020-07-27: qty 1

## 2020-07-27 NOTE — ED Triage Notes (Signed)
Pt BIB EMS from home for left-sided chest pain that began last night that worsened with time. Pt states hx of cardiac issues in family, no personal cardiac hx. Sharp pain, nonradiating. 324 aspirin given by EMS  EMS vitals: 136/68 Pulse 70 100% RA CBG 123 HR 70 NSR Temp 97.8  20G IV placed in L hand

## 2020-07-27 NOTE — ED Provider Notes (Signed)
[facility] EMERGENCY DEPARTMENT Provider Note   CSN: [id number] Arrival date & time: 07/27/20  7829     History No chief complaint on file.   Angelica Frank is a 60 y.o. female.  HPI      60yo female with history of asthma presents with concern for chest pain.  Under a lot of stress as brother was evicted.  Chest pain has been present for a few days, worsened last night, thought had panic attack last night as well. Not sure if it is anxiety related. Feels like a sharp pain, middle of chest, sometimes radiates to right. Not exertional, positional or pleuritic.  Has associated dyspnea. No nausea or vomiting.  Before bed last night felt like sweating some.  Not sure if leg swelling, both legs have some pain for a few days.  No abd pain, fever. Has been coughing some. Not coughing anything up. No wheezing. No congestion or sore throat.  Pain 9.5/10 right now.  Lips dry.   Prior asthma No known hx of htn, cholesterol or DM but does not see physician No smoking, etoh or other drugs Dad, mom grandma and grandpa diagnosed with heart disease, mom had MI in 61s, not sure about others  Past Medical History:  Diagnosis Date  . Asthma    diagnosed in her teens-no meds  . PONV (postoperative nausea and vomiting)     Patient Active Problem List   Diagnosis Date Noted  . Asthma-not on meds 03/09/2011  . Abdominal pain-Ddx Pancreatitis vs Chlocystits vs UTI 03/09/2011  . Obesity (BMI 30-39.9) 03/09/2011  . Leucocytosis 03/09/2011  . Cholecystitis 03/09/2011    Past Surgical History:  Procedure Laterality Date  . CHOLECYSTECTOMY  03/10/2011   Procedure: LAPAROSCOPIC CHOLECYSTECTOMY;  Surgeon: Emelia Loron, MD;  Location: [facility] OR;  Service: General;  Laterality: N/A;  . TUBAL LIGATION    . TUIBAL LIGATION     Had BTL  21 yes ago     OB History   No obstetric history on file.     Family History  Problem Relation Age of Onset  . Hypertension Father   . Diabetes  Father   . Coronary artery disease Father   . Arthritis Mother   . Coronary artery disease Mother   . Arrhythmia Maternal Grandmother   . Cancer Maternal Grandfather     Social History   Tobacco Use  . Smoking status: Current Every Day Smoker    Packs/day: 0.25    Types: Cigarettes  . Smokeless tobacco: Never Used  Substance Use Topics  . Alcohol use: Yes    Comment: on occasions  . Drug use: No    Home Medications Prior to Admission medications   Medication Sig Start Date End Date Taking? Authorizing Provider  acetaminophen (TYLENOL) 500 MG tablet Take 500-1,000 mg by mouth every 6 (six) hours as needed. For headache/pain   Yes [provider]  ibuprofen (ADVIL) 200 MG tablet Take 200 mg by mouth every 6 (six) hours as needed for headache or moderate pain.   Yes [provider]  pantoprazole (PROTONIX) 20 MG tablet Take 2 tablets (40 mg total) by mouth daily for 7 days. 07/27/20 08/03/20 Yes Alvira Monday, MD  albuterol (PROVENTIL HFA;VENTOLIN HFA) 108 (90 Base) MCG/ACT inhaler Inhale 1-2 puffs into the lungs every 6 (six) hours as needed for wheezing or shortness of breath. Patient not taking: Reported on 07/27/2020 07/10/17   Terrilee Files, MD  predniSONE (DELTASONE) 20 MG tablet  Take 3 tablets (60 mg total) by mouth daily. Patient not taking: Reported on 07/27/2020 07/10/17   Terrilee Files, MD    Allergies    Patient has no known allergies.  Review of Systems   Review of Systems  Constitutional: Negative for fever.  HENT: Negative for sore throat.   Eyes: Negative for visual disturbance.  Respiratory: Positive for cough and shortness of breath.   Cardiovascular: Positive for chest pain. Negative for leg swelling.  Gastrointestinal: Negative for abdominal pain, diarrhea, nausea and vomiting.  Genitourinary: Negative for difficulty urinating.  Musculoskeletal: Negative for back pain and neck pain.  Skin: Negative for rash.  Neurological: Negative  for syncope, light-headedness and headaches.    Physical Exam Updated Vital Signs BP 134/76   Pulse 67   Temp 98.3 F (36.8 C) (Oral)   Resp (!) 21   Ht 5\' 8"  (1.727 m)   Wt 104.3 kg   SpO2 98%   BMI 34.97 kg/m   Physical Exam Vitals and nursing note reviewed.  Constitutional:      General: She is not in acute distress.    Appearance: She is well-developed. She is not diaphoretic.  HENT:     Head: Normocephalic and atraumatic.  Eyes:     Conjunctiva/sclera: Conjunctivae normal.  Cardiovascular:     Rate and Rhythm: Normal rate and regular rhythm.     Heart sounds: Normal heart sounds. No murmur heard. No friction rub. No gallop.   Pulmonary:     Effort: Pulmonary effort is normal. No respiratory distress.     Breath sounds: Normal breath sounds. No wheezing or rales.  Chest:     Chest wall: Tenderness present.  Abdominal:     General: There is no distension.     Palpations: Abdomen is soft.     Tenderness: There is no abdominal tenderness. There is no guarding.  Musculoskeletal:        General: No tenderness.     Cervical back: Normal range of motion.  Skin:    General: Skin is warm and dry.     Findings: No erythema or rash.  Neurological:     Mental Status: She is alert and oriented to person, place, and time.     ED Results / Procedures / Treatments   Labs (all labs ordered are listed, but only abnormal results are displayed) Labs Reviewed  CBC WITH DIFFERENTIAL/PLATELET - Abnormal; Notable for the following components:      Result Value   RBC 3.59 (*)    Hemoglobin 11.7 (*)    HCT 34.5 (*)    Eosinophils Absolute 0.6 (*)    All other components within normal limits  COMPREHENSIVE METABOLIC PANEL - Abnormal; Notable for the following components:   Potassium 3.2 (*)    Chloride 112 (*)    CO2 20 (*)    Glucose, Bld 102 (*)    Creatinine, Ser 1.04 (*)    Calcium 8.4 (*)    All other components within normal limits  TROPONIN I (HIGH SENSITIVITY)   TROPONIN I (HIGH SENSITIVITY)    EKG EKG Interpretation  Date/Time:  Monday Jul 27 2020 06:42:44 EDT Ventricular Rate:  54 PR Interval:  160 QRS Duration: 96 QT Interval:  481 QTC Calculation: 456 R Axis:   -37 Text Interpretation: Sinus rhythm Left axis deviation Nonspecific T abnormalities, lateral leads No significant change since last tracing Confirmed by [id number] (Alvira Monday) on 07/27/2020 7:05:25 AM Also confirmed by 07/29/2020 (Alvira Monday), editor  Al Pimple ([id number])  on 07/28/2020 8:44:06 AM   Radiology DG Chest 2 View  Result Date: 07/27/2020 CLINICAL DATA:  Chest pain EXAM: CHEST - 2 VIEW COMPARISON:  07/10/2017 FINDINGS: Lungs are clear.  No pleural effusion or pneumothorax. The heart is normal in size. Visualized osseous structures are within normal limits. IMPRESSION: Normal chest radiographs. Electronically Signed   By: Charline Bills M.D.   On: 07/27/2020 07:44    Procedures Procedures   Medications Ordered in ED Medications  ketorolac (TORADOL) 30 MG/ML injection 30 mg (30 mg Intravenous Given 07/27/20 0830)  cyclobenzaprine (FLEXERIL) tablet 10 mg (10 mg Oral Given 07/27/20 0830)  potassium chloride SA (KLOR-CON) CR tablet 40 mEq (40 mEq Oral Given 07/27/20 0945)    ED Course  I have reviewed the triage vital signs and the nursing notes.  Pertinent labs & imaging results that were available during my care of the patient were reviewed by me and considered in my medical decision making (see chart for details).    MDM Rules/Calculators/A&P                          60yo female with history of asthma presents with concern for chest pain.  Differential diagnosis for chest pain includes pulmonary embolus, dissection, pneumothorax, pneumonia, ACS, myocarditis, pericarditis.  EKG was done and evaluate by me and showed no acute ST changes and no signs of pericarditis. Chest x-ray was done and evaluated by me and radiology and showed no sign of pneumonia or  pneumothorax. Have low suspicion for PE, no asymmetric leg swelling, no tachycardia, no hypoxia.  Patient is low risk HEART score and had delta troponins which were both negative.  Do not feel history or exam are consistent with aortic dissection.  Has chest wall tenderness, reports she is under stress.  Given family hx, recommend outpatient follow up with Cardiology and discussed strict return precautions. Will trial PPI. Patient discharged in stable condition with understanding of reasons to return.     Final Clinical Impression(s) / ED Diagnoses Final diagnoses:  Chest pain, unspecified type    Rx / DC Orders ED Discharge Orders         Ordered    pantoprazole (PROTONIX) 20 MG tablet  Daily        07/27/20 1119           Alvira Monday, MD 07/28/20 1330

## 2020-07-27 NOTE — ED Notes (Signed)
Patient transported to X-ray 

## 2021-04-22 ENCOUNTER — Encounter (HOSPITAL_COMMUNITY): Payer: Self-pay

## 2021-04-22 ENCOUNTER — Emergency Department (HOSPITAL_COMMUNITY)
Admission: EM | Admit: 2021-04-22 | Discharge: 2021-04-22 | Disposition: A | Discharge status: Home / Self Care | Payer: Self-pay | Attending: Emergency Medicine | Admitting: Emergency Medicine

## 2021-04-22 ENCOUNTER — Emergency Department (HOSPITAL_COMMUNITY): Payer: Self-pay

## 2021-04-22 DIAGNOSIS — R072 Precordial pain: Secondary | ICD-10-CM | POA: Insufficient documentation

## 2021-04-22 DIAGNOSIS — Z7951 Long term (current) use of inhaled steroids: Secondary | ICD-10-CM | POA: Insufficient documentation

## 2021-04-22 DIAGNOSIS — Z20822 Contact with and (suspected) exposure to covid-19: Secondary | ICD-10-CM | POA: Insufficient documentation

## 2021-04-22 DIAGNOSIS — R0602 Shortness of breath: Secondary | ICD-10-CM

## 2021-04-22 DIAGNOSIS — J45909 Unspecified asthma, uncomplicated: Secondary | ICD-10-CM | POA: Insufficient documentation

## 2021-04-22 DIAGNOSIS — R059 Cough, unspecified: Secondary | ICD-10-CM | POA: Insufficient documentation

## 2021-04-22 DIAGNOSIS — R195 Other fecal abnormalities: Secondary | ICD-10-CM

## 2021-04-22 DIAGNOSIS — D649 Anemia, unspecified: Secondary | ICD-10-CM | POA: Insufficient documentation

## 2021-04-22 LAB — CBC WITH DIFFERENTIAL/PLATELET
Abs Immature Granulocytes: 0.02 10*3/uL (ref 0.00–0.07)
Basophils Absolute: 0.1 10*3/uL (ref 0.0–0.1)
Basophils Relative: 1 %
Eosinophils Absolute: 0.7 10*3/uL — ABNORMAL HIGH (ref 0.0–0.5)
Eosinophils Relative: 9 %
HCT: 25.3 % — ABNORMAL LOW (ref 36.0–46.0)
Hemoglobin: 9.1 g/dL — ABNORMAL LOW (ref 12.0–15.0)
Immature Granulocytes: 0 %
Lymphocytes Relative: 29 %
Lymphs Abs: 2 10*3/uL (ref 0.7–4.0)
MCH: 43.8 pg — ABNORMAL HIGH (ref 26.0–34.0)
MCHC: 36 g/dL (ref 30.0–36.0)
MCV: 121.6 fL — ABNORMAL HIGH (ref 80.0–100.0)
Monocytes Absolute: 0.5 10*3/uL (ref 0.1–1.0)
Monocytes Relative: 7 %
Neutro Abs: 3.8 10*3/uL (ref 1.7–7.7)
Neutrophils Relative %: 54 %
Platelets: 237 10*3/uL (ref 150–400)
RBC: 2.08 MIL/uL — ABNORMAL LOW (ref 3.87–5.11)
RDW: 14.4 % (ref 11.5–15.5)
Smear Review: NORMAL
WBC: 7 10*3/uL (ref 4.0–10.5)
nRBC: 0 % (ref 0.0–0.2)

## 2021-04-22 LAB — RESP PANEL BY RT-PCR (FLU A&B, COVID) ARPGX2
Influenza A by PCR: NEGATIVE
Influenza B by PCR: NEGATIVE
SARS Coronavirus 2 by RT PCR: NEGATIVE

## 2021-04-22 LAB — TROPONIN I (HIGH SENSITIVITY): Troponin I (High Sensitivity): 3 ng/L (ref <18)

## 2021-04-22 LAB — BASIC METABOLIC PANEL
Anion gap: 12 (ref 5–15)
BUN: 13 mg/dL (ref 6–20)
CO2: 21 mmol/L — ABNORMAL LOW (ref 22–32)
Calcium: 8.9 mg/dL (ref 8.9–10.3)
Chloride: 106 mmol/L (ref 98–111)
Creatinine, Ser: 1.31 mg/dL — ABNORMAL HIGH (ref 0.44–1.00)
GFR, Estimated: 47 mL/min — ABNORMAL LOW (ref >60)
Glucose, Bld: 127 mg/dL — ABNORMAL HIGH (ref 70–99)
Potassium: 3.6 mmol/L (ref 3.5–5.1)
Sodium: 139 mmol/L (ref 135–145)

## 2021-04-22 LAB — BRAIN NATRIURETIC PEPTIDE: B Natriuretic Peptide: 57.3 pg/mL (ref 0.0–100.0)

## 2021-04-22 LAB — POC OCCULT BLOOD, ED: Fecal Occult Bld: POSITIVE — AB

## 2021-04-22 MED ORDER — FAMOTIDINE 20 MG PO TABS
20.0000 mg | ORAL_TABLET | Freq: Once | ORAL | Status: AC
  Administered 2021-04-22: 20 mg via ORAL
  Filled 2021-04-22: qty 1

## 2021-04-22 MED ORDER — PANTOPRAZOLE SODIUM 40 MG PO TBEC: 40.0000 mg | DELAYED_RELEASE_TABLET | Freq: Every day | ORAL | 0 refills | Status: DC

## 2021-04-22 MED ORDER — ALBUTEROL SULFATE (2.5 MG/3ML) 0.083% IN NEBU
5.0000 mg | INHALATION_SOLUTION | Freq: Once | RESPIRATORY_TRACT | Status: AC
  Administered 2021-04-22: 5 mg via RESPIRATORY_TRACT
  Filled 2021-04-22: qty 6

## 2021-04-22 MED ORDER — ALBUTEROL SULFATE HFA 108 (90 BASE) MCG/ACT IN AERS: 2.0000 | INHALATION_SPRAY | RESPIRATORY_TRACT | 1 refills | Status: DC | PRN

## 2021-04-22 MED ORDER — ALUM & MAG HYDROXIDE-SIMETH 200-200-20 MG/5ML PO SUSP
30.0000 mL | Freq: Once | ORAL | Status: AC
  Administered 2021-04-22: 30 mL via ORAL
  Filled 2021-04-22: qty 30

## 2021-04-22 MED ORDER — PANTOPRAZOLE SODIUM 40 MG PO TBEC: 40.0000 mg | DELAYED_RELEASE_TABLET | Freq: Every day | ORAL | 0 refills | Status: AC

## 2021-04-22 MED ORDER — ACETAMINOPHEN 500 MG PO TABS
1000.0000 mg | ORAL_TABLET | Freq: Once | ORAL | Status: AC
  Administered 2021-04-22: 1000 mg via ORAL
  Filled 2021-04-22: qty 2

## 2021-04-22 NOTE — ED Provider Triage Note (Signed)
Emergency Medicine Provider Triage Evaluation Note  Angelica Frank , a 61 y.o. female  was evaluated in triage.  Pt complains of shortness of breath.  Patient states over the past couple of weeks she has developed worsening shortness of breath. She states that it is worse when lying flat and with exertion. She has had associated chest pains in the center of her chest that does not radiate. She states that she has also noticed her right knee pain flaring up. She has a history of OA and this feels similar to that. She denies any leg swelling, abdominal pain, nausea, vomiting, hx of CHF, hx of blood clot, hx of cancer,etc.   Review of Systems  Positive:  Negative:   Physical Exam  BP 135/72 (BP Location: Right Arm)    Pulse 92    Temp 99.5 F (37.5 C) (Oral)    Resp 20    SpO2 97%  Gen:   Awake, no distress   Resp:  Normal effort  MSK:   Moves extremities without difficulty  Other:  Right knee without swelling. Able to range right knee. Pain on anterior aspect. No pain in posterior calf, knee, or thigh.   Medical Decision Making  Medically screening exam initiated at 1:00 PM.  Appropriate orders placed.  Jaylnn Ullery was informed that the remainder of the evaluation will be completed by another provider, this initial triage assessment does not replace that evaluation, and the importance of remaining in the ED until their evaluation is complete.     Claudie Leach, [place] 04/22/21 1312

## 2021-04-22 NOTE — ED Triage Notes (Signed)
Pt c/o worsening DOE and orthopnea over the last three weeks. Pt states that she also has intermittent L sided CP.   During triage pt states that she also has R knee pain chronically for the last three years, but it has recently limited her ADLs. No known trauma to area.

## 2021-04-22 NOTE — ED Provider Notes (Signed)
[facility]ERGENCY [facility]PARTMENT Provider Note   CSN: [id number] Arrival date & time: 04/22/21  1249     History  Chief Complaint  Patient presents with   Shortness of Breath    Angelica MillingDolly Rahe is a 61 y.o. female.  Patient w remote hx asthma, c/o sob and chest discomfort in past two days, symptoms midline area, constant, non radiating, not pleuritic, at rest. No positional change. No change w activity. Denies hx cad. +fam hx cad in older age. Denies associated nv or diaphoresis. Denies heartburn. No abd pain or nv. +non prod cough, no sore throat. No known covid or flu exposure. No fever or chills. No chest wall injury or strain. No leg pain or swelling. No recent surgery, trauma, travel or immobility. No hx dvt or pe. +smoker. As relates asthma, no regular mdi use.   The history is provided by the patient and medical records.  Shortness of Breath Associated symptoms: cough   Associated symptoms: no abdominal pain, no chest pain, no fever, no headaches, no neck pain, no rash, no sore throat and no vomiting       Home Medications Prior to Admission medications   Medication Sig Start Date End Date Taking? Authorizing Provider  acetaminophen (TYLENOL) 500 MG tablet Take 500-1,000 mg by mouth every 6 (six) hours as needed. For headache/pain    [provider]  albuterol (PROVENTIL HFA;VENTOLIN HFA) 108 (90 Base) MCG/ACT inhaler Inhale 1-2 puffs into the lungs every 6 (six) hours as needed for wheezing or shortness of breath. Patient not taking: Reported on 07/27/2020 07/10/17   Terrilee FilesButler, Michael C, MD  ibuprofen (ADVIL) 200 MG tablet Take 200 mg by mouth every 6 (six) hours as needed for headache or moderate pain.    [provider]  pantoprazole (PROTONIX) 20 MG tablet Take 2 tablets (40 mg total) by mouth daily for 7 days. 07/27/20 08/03/20  Alvira MondaySchlossman, Erin, MD  predniSONE (DELTASONE) 20 MG tablet Take 3 tablets (60 mg total) by mouth daily. Patient not  taking: Reported on 07/27/2020 07/10/17   Terrilee FilesButler, Michael C, MD      Allergies    Patient has no known allergies.    Review of Systems   Review of Systems  Constitutional:  Negative for chills and fever.  HENT:  Negative for sore throat.   Eyes:  Negative for redness.  Respiratory:  Positive for cough and shortness of breath.   Cardiovascular:  Negative for chest pain, palpitations and leg swelling.  Gastrointestinal:  Negative for abdominal pain, diarrhea and vomiting.  Genitourinary:  Negative for flank pain.  Musculoskeletal:  Negative for back pain and neck pain.  Skin:  Negative for rash.  Neurological:  Negative for headaches.  Hematological:  Does not bruise/bleed easily.  Psychiatric/Behavioral:  Negative for confusion.    Physical Exam Updated Vital Signs BP 135/72 (BP Location: Right Arm)    Pulse 92    Temp 99.5 F (37.5 C) (Oral)    Resp 20    Ht 1.727 m (5\' 8" )    Wt 77.1 kg    SpO2 97%    BMI 25.85 kg/m  Physical Exam Vitals and nursing note reviewed.  Constitutional:      Appearance: Normal appearance. Angelica Frank is well-developed.  HENT:     Head: Atraumatic.     Nose: Nose normal.     Mouth/Throat:     Mouth: Mucous membranes are moist.  Eyes:     General: No scleral icterus.  Conjunctiva/sclera: Conjunctivae normal.  Neck:     Trachea: No tracheal deviation.  Cardiovascular:     Rate and Rhythm: Normal rate and regular rhythm.     Pulses: Normal pulses.     Heart sounds: Normal heart sounds. No murmur heard.   No friction rub. No gallop.  Pulmonary:     Effort: Pulmonary effort is normal. No respiratory distress.     Breath sounds: Normal breath sounds.     Comments: No sts. No skin lesions/redness in area of pain. Mild anterior chest wall tenderness. No crepitus. Decreased bs bil, symmetric, ?sl wheeze.  Abdominal:     General: Bowel sounds are normal. There is no distension.     Palpations: Abdomen is soft.     Tenderness: There is no abdominal  tenderness. There is no guarding.  Genitourinary:    Comments: No cva tenderness. Medium brown stool on rectal, no melena, no mass felt.  Musculoskeletal:        General: No swelling or tenderness.     Cervical back: Normal range of motion and neck supple. No rigidity. No muscular tenderness.     Right lower leg: No edema.     Left lower leg: No edema.     Comments: Good rom bil legs/knees without severe pain. No swelling noted.   Skin:    General: Skin is warm and dry.     Findings: No rash.  Neurological:     Mental Status: Angelica Frank is alert.     Comments: Alert, speech normal.   Psychiatric:        Mood and Affect: Mood normal.    ED Results / Procedures / Treatments   Labs (all labs ordered are listed, but only abnormal results are displayed) Results for orders placed or performed during the hospital encounter of 04/22/21  Basic metabolic panel  Result Value Ref Range   Sodium 139 135 - 145 mmol/L   Potassium 3.6 3.5 - 5.1 mmol/L   Chloride 106 98 - 111 mmol/L   CO2 21 (L) 22 - 32 mmol/L   Glucose, Bld 127 (H) 70 - 99 mg/dL   BUN 13 6 - 20 mg/dL   Creatinine, Ser 9.74 (H) 0.44 - 1.00 mg/dL   Calcium 8.9 8.9 - 16.3 mg/dL   GFR, Estimated 47 (L) >60 mL/min   Anion gap 12 5 - 15  CBC with Differential  Result Value Ref Range   WBC 7.0 4.0 - 10.5 K/uL   RBC 2.08 (L) 3.87 - 5.11 MIL/uL   Hemoglobin 9.1 (L) 12.0 - 15.0 g/dL   HCT 84.5 (L) 36.4 - 68.0 %   MCV 121.6 (H) 80.0 - 100.0 fL   MCH 43.8 (H) 26.0 - 34.0 pg   MCHC 36.0 30.0 - 36.0 g/dL   RDW 32.1 22.4 - 82.5 %   Platelets 237 150 - 400 K/uL   nRBC 0.0 0.0 - 0.2 %   Neutrophils Relative % 54 %   Neutro Abs 3.8 1.7 - 7.7 K/uL   Lymphocytes Relative 29 %   Lymphs Abs 2.0 0.7 - 4.0 K/uL   Monocytes Relative 7 %   Monocytes Absolute 0.5 0.1 - 1.0 K/uL   Eosinophils Relative 9 %   Eosinophils Absolute 0.7 (H) 0.0 - 0.5 K/uL   Basophils Relative 1 %   Basophils Absolute 0.1 0.0 - 0.1 K/uL   WBC Morphology MORPHOLOGY  UNREMARKABLE    RBC Morphology MORPHOLOGY UNREMARKABLE    Smear Review Normal platelet morphology  Immature Granulocytes 0 %   Abs Immature Granulocytes 0.02 0.00 - 0.07 K/uL  Brain natriuretic peptide  Result Value Ref Range   B Natriuretic Peptide 57.3 0.0 - 100.0 pg/mL  POC occult blood, ED Provider will collect  Result Value Ref Range   Fecal Occult Bld POSITIVE (A) NEGATIVE  Troponin I (High Sensitivity)  Result Value Ref Range   Troponin I (High Sensitivity) 3 <18 ng/L   DG Chest 2 View  Result Date: 04/22/2021 CLINICAL DATA:  Shortness of breath EXAM: CHEST - 2 VIEW COMPARISON:  Previous studies including the examination of 07/27/2020 FINDINGS: The heart size and mediastinal contours are within normal limits. Both lungs are clear. The visualized skeletal structures are unremarkable. IMPRESSION: No active cardiopulmonary disease. Electronically Signed   By: Ernie Avena M.D.   On: 04/22/2021 13:28   DG Knee Complete 4 Views Right  Result Date: 04/22/2021 CLINICAL DATA:  Pain EXAM: RIGHT KNEE - COMPLETE 4+ VIEW COMPARISON:  11/27/2006 FINDINGS: No recent fracture or dislocation is seen. There is no significant effusion. There is cortical irregularity in the medial margin of medial condyle of distal right femur, possibly residual from previous injury. Small bony spurs seen in the patella. IMPRESSION: No recent fracture or dislocation is seen. There is no significant effusion. Degenerative changes are noted with small bony spurs in the patella. Electronically Signed   By: Ernie Avena M.D.   On: 04/22/2021 13:30     EKG EKG Interpretation  Date/Time:  Thursday April 22 2021 12:55:43 EST Ventricular Rate:  86 PR Interval:  138 QRS Duration: 74 QT Interval:  368 QTC Calculation: 440 R Axis:   -22 Text Interpretation: Normal sinus rhythm Confirmed by Cathren Laine ([id number]) on 04/22/2021 3:02:42 PM  Radiology DG Chest 2 View  Result Date: 04/22/2021 CLINICAL  DATA:  Shortness of breath EXAM: CHEST - 2 VIEW COMPARISON:  Previous studies including the examination of 07/27/2020 FINDINGS: The heart size and mediastinal contours are within normal limits. Both lungs are clear. The visualized skeletal structures are unremarkable. IMPRESSION: No active cardiopulmonary disease. Electronically Signed   By: Ernie Avena M.D.   On: 04/22/2021 13:28   DG Knee Complete 4 Views Right  Result Date: 04/22/2021 CLINICAL DATA:  Pain EXAM: RIGHT KNEE - COMPLETE 4+ VIEW COMPARISON:  11/27/2006 FINDINGS: No recent fracture or dislocation is seen. There is no significant effusion. There is cortical irregularity in the medial margin of medial condyle of distal right femur, possibly residual from previous injury. Small bony spurs seen in the patella. IMPRESSION: No recent fracture or dislocation is seen. There is no significant effusion. Degenerative changes are noted with small bony spurs in the patella. Electronically Signed   By: Ernie Avena M.D.   On: 04/22/2021 13:30    Procedures Procedures    Medications Ordered in ED Medications  alum & mag hydroxide-simeth (MAALOX/MYLANTA) 200-200-20 MG/5ML suspension 30 mL (has no administration in time range)  famotidine (PEPCID) tablet 20 mg (has no administration in time range)  acetaminophen (TYLENOL) tablet 1,000 mg (has no administration in time range)    ED Course/ Medical Decision Making/ A&P                           Medical Decision Making Problems Addressed: Anemia, unspecified type: chronic illness or injury with exacerbation, progression, or side effects of treatment Heme positive stool: acute illness or injury Precordial chest pain: acute illness or injury that poses  a threat to life or bodily functions Shortness of breath: acute illness or injury  Amount and/or Complexity of Data Reviewed External Data Reviewed: notes. Labs: ordered. Decision-making details documented in ED Course. Radiology:  ordered and independent interpretation performed. Decision-making details documented in ED Course. ECG/medicine tests: ordered and independent interpretation performed. Decision-making details documented in ED Course.  Risk OTC drugs. Prescription drug management. Decision regarding hospitalization. Diagnosis or treatment significantly limited by social determinants of health.   Iv ns. Continuous pulse ox and cardiac monitoring. Stat labs. Ecg. Cxr.  Considered dispo decision, including admission re cp, factoring in social determinant issues/access - will check labs and imaging, and reassess.   Reviewed nursing notes and prior charts for additional history. External reports reviewed.   Cardiac monitor: sinus rhythm, rate 90.   Labs reviewed/interpreted by me  - trop normal - after symptoms, present/constant, for two days, felt not c/w acs.  Hgb is lower than last year - pt denies blood loss, vaginal bleeding, blood in urine or stool, melena, nosebleeds, etcs. Stool is normal color but hemoccult pos.   Albuterol tx. Acetaminophen po, pepcid po, maalox po.  Recheck pt, no increased wob, pulse ox 99% room air. No cp.           Final Clinical Impression(s) / ED Diagnoses Final diagnoses:  None    Rx / DC Orders ED Discharge Orders     None         Cathren Laine, MD 04/22/21 [phone]

## 2021-04-22 NOTE — Discharge Instructions (Addendum)
It was our pleasure to provide your ER care today - we hope that you feel better.  Your covid and flu test is pending - results should be back in one hour - you may check Mychart or call for results.   From today's labs, your heart tests look good/normal. Your blood count/hemoglobin is lower than prior (9) and stool does test positive for blood. Avoid taking any anti-inflammatory type medicine such as ibuprofen/motrin, aleve/naprosyn, or goodys.   Drink plenty of fluids/stay well hydrated. Take protonix (acid blocker). Follow up with GI doctor in one week - call office tomorrow for appointment.   For recent chest discomfort, follow up closely with cardiologist in the next 1-2 weeks - call office to arrange appointment.   Avoid any smoking. If wheezing, use albuterol inhaler as need.  Return to ER if worse, new symptoms, persistent or recurrent chest pain, increased trouble breathing, weak/fainting, fevers, or other concern.

## 2021-05-06 ENCOUNTER — Emergency Department (HOSPITAL_COMMUNITY)
Admission: EM | Admit: 2021-05-06 | Discharge: 2021-05-07 | Disposition: A | Discharge status: Home / Self Care | Payer: Self-pay | Attending: Emergency Medicine | Admitting: Emergency Medicine

## 2021-05-06 ENCOUNTER — Emergency Department (HOSPITAL_COMMUNITY): Payer: Self-pay

## 2021-05-06 DIAGNOSIS — J45909 Unspecified asthma, uncomplicated: Secondary | ICD-10-CM | POA: Diagnosis not present

## 2021-05-06 DIAGNOSIS — M542 Cervicalgia: Secondary | ICD-10-CM | POA: Insufficient documentation

## 2021-05-06 DIAGNOSIS — R0789 Other chest pain: Secondary | ICD-10-CM | POA: Diagnosis not present

## 2021-05-06 DIAGNOSIS — M546 Pain in thoracic spine: Secondary | ICD-10-CM | POA: Insufficient documentation

## 2021-05-06 DIAGNOSIS — Y92410 Unspecified street and highway as the place of occurrence of the external cause: Secondary | ICD-10-CM | POA: Diagnosis not present
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
## 2021-05-07 MED ORDER — METHOCARBAMOL 500 MG PO TABS
500.0000 mg | ORAL_TABLET | Freq: Once | ORAL | Status: AC
Start: 2021-05-07 — End: 2021-05-07
  Administered 2021-05-07: 500 mg via ORAL
  Filled 2021-05-07: qty 1

## 2021-05-07 MED ORDER — OXYCODONE-ACETAMINOPHEN 5-325 MG PO TABS
1.0000 | ORAL_TABLET | Freq: Once | ORAL | Status: AC
  Administered 2021-05-07: 1 via ORAL
  Filled 2021-05-07: qty 1

## 2021-05-07 MED ORDER — METHOCARBAMOL 500 MG PO TABS: 500.0000 mg | ORAL_TABLET | Freq: Two times a day (BID) | ORAL | 0 refills | Status: AC

## 2021-05-07 MED ORDER — OXYCODONE-ACETAMINOPHEN 5-325 MG PO TABS: 1.0000 | ORAL_TABLET | ORAL | 0 refills | Status: AC | PRN
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
## 2021-06-02 ENCOUNTER — Encounter (HOSPITAL_COMMUNITY): Payer: Self-pay | Admitting: Radiology

## 2021-11-28 ENCOUNTER — Encounter (HOSPITAL_COMMUNITY): Payer: Self-pay

## 2021-11-28 ENCOUNTER — Other Ambulatory Visit: Payer: Self-pay

## 2021-11-28 ENCOUNTER — Emergency Department (HOSPITAL_COMMUNITY)
Admission: EM | Admit: 2021-11-28 | Discharge: 2021-11-28 | Disposition: A | Discharge status: Home / Self Care | Payer: Self-pay | Attending: Emergency Medicine | Admitting: Emergency Medicine

## 2021-11-28 ENCOUNTER — Emergency Department (HOSPITAL_COMMUNITY): Payer: Self-pay

## 2021-11-28 DIAGNOSIS — Z79899 Other long term (current) drug therapy: Secondary | ICD-10-CM | POA: Insufficient documentation

## 2021-11-28 DIAGNOSIS — Z7951 Long term (current) use of inhaled steroids: Secondary | ICD-10-CM | POA: Insufficient documentation

## 2021-11-28 DIAGNOSIS — R109 Unspecified abdominal pain: Secondary | ICD-10-CM | POA: Insufficient documentation

## 2021-11-28 DIAGNOSIS — J45909 Unspecified asthma, uncomplicated: Secondary | ICD-10-CM | POA: Insufficient documentation

## 2021-11-28 DIAGNOSIS — R630 Anorexia: Secondary | ICD-10-CM | POA: Insufficient documentation

## 2021-11-28 DIAGNOSIS — D649 Anemia, unspecified: Secondary | ICD-10-CM | POA: Insufficient documentation

## 2021-11-28 DIAGNOSIS — F17200 Nicotine dependence, unspecified, uncomplicated: Secondary | ICD-10-CM | POA: Insufficient documentation

## 2021-11-28 LAB — ACETAMINOPHEN LEVEL: Acetaminophen (Tylenol), Serum: <10 ug/mL — ABNORMAL LOW (ref 10–30)

## 2021-11-28 LAB — COMPREHENSIVE METABOLIC PANEL
ALT: 13 U/L (ref 0–44)
AST: 31 U/L (ref 15–41)
Albumin: 3.8 g/dL (ref 3.5–5.0)
Alkaline Phosphatase: 43 U/L (ref 38–126)
Anion gap: 9 (ref 5–15)
BUN: 16 mg/dL (ref 6–20)
CO2: 23 mmol/L (ref 22–32)
Calcium: 8.7 mg/dL — ABNORMAL LOW (ref 8.9–10.3)
Chloride: 110 mmol/L (ref 98–111)
Creatinine, Ser: 1.43 mg/dL — ABNORMAL HIGH (ref 0.44–1.00)
GFR, Estimated: 42 mL/min — ABNORMAL LOW (ref >60)
Glucose, Bld: 111 mg/dL — ABNORMAL HIGH (ref 70–99)
Potassium: 3.6 mmol/L (ref 3.5–5.1)
Sodium: 142 mmol/L (ref 135–145)
Total Bilirubin: 1.1 mg/dL (ref 0.3–1.2)
Total Protein: 6.6 g/dL (ref 6.5–8.1)

## 2021-11-28 LAB — CBC
HCT: 22.4 % — ABNORMAL LOW (ref 36.0–46.0)
Hemoglobin: 7.7 g/dL — ABNORMAL LOW (ref 12.0–15.0)
MCH: 44.5 pg — ABNORMAL HIGH (ref 26.0–34.0)
MCHC: 34.4 g/dL (ref 30.0–36.0)
MCV: 129.5 fL — ABNORMAL HIGH (ref 80.0–100.0)
Platelets: 218 10*3/uL (ref 150–400)
RBC: 1.73 MIL/uL — ABNORMAL LOW (ref 3.87–5.11)
RDW: 13.4 % (ref 11.5–15.5)
WBC: 4.8 10*3/uL (ref 4.0–10.5)
nRBC: 0 % (ref 0.0–0.2)

## 2021-11-28 LAB — RAPID URINE DRUG SCREEN, HOSP PERFORMED
Amphetamines: NOT DETECTED
Barbiturates: NOT DETECTED
Benzodiazepines: NOT DETECTED
Cocaine: NOT DETECTED
Opiates: NOT DETECTED
Tetrahydrocannabinol: NOT DETECTED

## 2021-11-28 LAB — SALICYLATE LEVEL: Salicylate Lvl: <7 mg/dL — ABNORMAL LOW (ref 7.0–30.0)

## 2021-11-28 LAB — ETHANOL: Alcohol, Ethyl (B): <10 mg/dL (ref <10)

## 2021-11-28 LAB — I-STAT BETA HCG BLOOD, ED (MC, WL, AP ONLY): I-stat hCG, quantitative: <5 m[IU]/mL (ref <5)

## 2021-11-28 MED ORDER — IOHEXOL 350 MG/ML SOLN
60.0000 mL | Freq: Once | INTRAVENOUS | Status: AC | PRN
  Administered 2021-11-28: 60 mL via INTRAVENOUS

## 2021-11-28 NOTE — Discharge Instructions (Addendum)
Follow-up with the wellness clinic.  We talked about the need may be to check a urine to make sure no urinary tract infection.  But he said he had no symptoms.  If he develops symptoms get seen.  For behavioral health concerns if they recur I know he feels fine now recommend [facility].

## 2021-11-28 NOTE — ED Triage Notes (Signed)
Patient arrived by [facility] reporting that she broke up with boyfriend and having trouble eating and sleeping, stating that she is depressed and anxious. Denies SI/HI

## 2021-11-28 NOTE — ED Provider Triage Note (Signed)
Emergency Medicine Provider Triage Evaluation Note  Angelica Frank , a 61 y.o. female  was evaluated in triage.  Pt complains of abdominal pain, lack of appetite for the past 2 days.  States this started after breaking up with her boyfriend.  Work-up done previously shows hemoglobin of 7.7 which is downtrending from previous.  Denies any signs of bleeding.  Denies melanotic stools.  Review of Systems  Positive: As above Negative: As above  Physical Exam  BP 111/71   Pulse 78   Temp 98.7 F (37.1 C) (Oral)   Resp 14   SpO2 100%  Gen:   Awake, no distress   Resp:  Normal effort  MSK:   Moves extremities without difficulty  Other:    Medical Decision Making  Medically screening exam initiated at 2:18 PM.  Appropriate orders placed.  Letticia Bhattacharyya was informed that the remainder of the evaluation will be completed by another provider, this initial triage assessment does not replace that evaluation, and the importance of remaining in the ED until their evaluation is complete.     Evlyn Courier, PA-C 11/28/21 1420

## 2021-11-28 NOTE — ED Provider Notes (Signed)
[facility] EMERGENCY DEPARTMENT Provider Note   CSN: [id number] Arrival date & time: 11/28/21  1034     History  No chief complaint on file.   Angelica Frank is a 61 y.o. female.  Patient lives in [place].  Patient brought in by EMS states she broke up with her boyfriend having trouble eating and sleeping stating she is depressed and anxious.  But denies any suicidal homicidal ideation.  Patient now states she feels better.  Patient still denies suicidal homicidal ideation.  Patient is an everyday smoker patient has a history of asthma.  The break-up occurred about 3 days ago patient's had her gallbladder removed.  Patient now feels better.       Home Medications Prior to Admission medications   Medication Sig Start Date End Date Taking? Authorizing Provider  acetaminophen (TYLENOL) 500 MG tablet Take 500-1,000 mg by mouth every 6 (six) hours as needed. For headache/pain    [provider]  albuterol (PROVENTIL HFA;VENTOLIN HFA) 108 (90 Base) MCG/ACT inhaler Inhale 1-2 puffs into the lungs every 6 (six) hours as needed for wheezing or shortness of breath. Patient not taking: Reported on 07/27/2020 07/10/17   Terrilee Files, MD  albuterol (VENTOLIN HFA) 108 (90 Base) MCG/ACT inhaler Inhale 2 puffs into the lungs every 4 (four) hours as needed for wheezing or shortness of breath. 04/22/21   Cathren Laine, MD  ibuprofen (ADVIL) 200 MG tablet Take 200 mg by mouth every 6 (six) hours as needed for headache or moderate pain.    [provider]  methocarbamol (ROBAXIN) 500 MG tablet Take 1 tablet (500 mg total) by mouth 2 (two) times daily. 05/07/21   Garlon Hatchet, PA-C  oxyCODONE-acetaminophen (PERCOCET) 5-325 MG tablet Take 1 tablet by mouth every 4 (four) hours as needed. 05/07/21   Garlon Hatchet, PA-C  pantoprazole (PROTONIX) 20 MG tablet Take 2 tablets (40 mg total) by mouth daily for 7 days. 07/27/20 08/03/20  Alvira Monday, MD   pantoprazole (PROTONIX) 40 MG tablet Take 1 tablet (40 mg total) by mouth daily. 04/22/21   Cathren Laine, MD  predniSONE (DELTASONE) 20 MG tablet Take 3 tablets (60 mg total) by mouth daily. Patient not taking: Reported on 07/27/2020 07/10/17   Terrilee Files, MD      Allergies    Patient has no known allergies.    Review of Systems   Review of Systems  Constitutional:  Negative for chills and fever.  HENT:  Negative for ear pain and sore throat.   Eyes:  Negative for pain and visual disturbance.  Respiratory:  Negative for cough and shortness of breath.   Cardiovascular:  Negative for chest pain and palpitations.  Gastrointestinal:  Negative for abdominal pain and vomiting.  Genitourinary:  Negative for dysuria and hematuria.  Musculoskeletal:  Negative for arthralgias and back pain.  Skin:  Negative for color change and rash.  Neurological:  Negative for seizures and syncope.  Psychiatric/Behavioral:  Positive for sleep disturbance. Negative for suicidal ideas. The patient is nervous/anxious.   All other systems reviewed and are negative.   Physical Exam Updated Vital Signs BP 122/61   Pulse 71   Temp 98.9 F (37.2 C) (Oral)   Resp 16   SpO2 100%  Physical Exam Vitals and nursing note reviewed.  Constitutional:      General: She is not in acute distress.    Appearance: She is well-developed. She is not ill-appearing or diaphoretic.  HENT:  Head: Normocephalic and atraumatic.  Eyes:     Conjunctiva/sclera: Conjunctivae normal.     Pupils: Pupils are equal, round, and reactive to light.  Cardiovascular:     Rate and Rhythm: Normal rate and regular rhythm.     Heart sounds: No murmur heard. Pulmonary:     Effort: Pulmonary effort is normal. No respiratory distress.     Breath sounds: Normal breath sounds. No wheezing, rhonchi or rales.  Abdominal:     Palpations: Abdomen is soft.     Tenderness: There is no abdominal tenderness.  Musculoskeletal:         General: No swelling.     Cervical back: Neck supple.  Skin:    General: Skin is warm and dry.     Capillary Refill: Capillary refill takes less than 2 seconds.  Neurological:     General: No focal deficit present.     Mental Status: She is alert and oriented to person, place, and time.     Cranial Nerves: No cranial nerve deficit.     Sensory: No sensory deficit.  Psychiatric:        Mood and Affect: Mood normal.     ED Results / Procedures / Treatments   Labs (all labs ordered are listed, but only abnormal results are displayed) Labs Reviewed  COMPREHENSIVE METABOLIC PANEL - Abnormal; Notable for the following components:      Result Value   Glucose, Bld 111 (*)    Creatinine, Ser 1.43 (*)    Calcium 8.7 (*)    GFR, Estimated 42 (*)    All other components within normal limits  SALICYLATE LEVEL - Abnormal; Notable for the following components:   Salicylate Lvl <6.1 (*)    All other components within normal limits  ACETAMINOPHEN LEVEL - Abnormal; Notable for the following components:   Acetaminophen (Tylenol), Serum <10 (*)    All other components within normal limits  CBC - Abnormal; Notable for the following components:   RBC 1.73 (*)    Hemoglobin 7.7 (*)    HCT 22.4 (*)    MCV 129.5 (*)    MCH 44.5 (*)    All other components within normal limits  ETHANOL  RAPID URINE DRUG SCREEN, HOSP PERFORMED  I-STAT BETA HCG BLOOD, ED ([facility], [facility], [facility] ONLY)    EKG None  Radiology CT ABDOMEN PELVIS W CONTRAST  Result Date: 11/28/2021 CLINICAL DATA:  Abdominal pain, lack of appetite EXAM: CT ABDOMEN AND PELVIS WITH CONTRAST TECHNIQUE: Multidetector CT imaging of the abdomen and pelvis was performed using the standard protocol following bolus administration of intravenous contrast. RADIATION DOSE REDUCTION: This exam was performed according to the departmental dose-optimization program which includes automated exposure control, adjustment of the mA and/or kV according to patient  size and/or use of iterative reconstruction technique. CONTRAST:  28mL OMNIPAQUE IOHEXOL 350 MG/ML SOLN COMPARISON:  11/03/2013 FINDINGS: Lower chest: No acute abnormality. Hepatobiliary: No focal liver abnormality is seen. Status post cholecystectomy. No biliary dilatation. Pancreas: Unremarkable. No pancreatic ductal dilatation or surrounding inflammatory changes. Spleen: Normal in size without significant abnormality. Adrenals/Urinary Tract: Adrenal glands are unremarkable. Kidneys are normal, without renal calculi, solid lesion, or hydronephrosis. Decompressed urinary bladder, with mucosal thickening and hyperenhancement (series 7, image 67). Stomach/Bowel: Stomach is within normal limits. Appendix appears normal. No evidence of bowel wall thickening, distention, or inflammatory changes. Vascular/Lymphatic: No significant vascular findings are present. No overtly enlarged abdominal or pelvic lymph nodes. Prominent right lower quadrant mesenteric lymph nodes, unchanged  compared to remote prior examination dated 2015, reactive and benign (series 3, image 45). Reproductive: No mass or other significant abnormality. Other: No abdominal wall hernia or abnormality. No ascites. Musculoskeletal: No acute or significant osseous findings. IMPRESSION: 1. Decompressed urinary bladder, with mucosal thickening and hyperenhancement, suggesting infectious or inflammatory cystitis. Correlate with urinalysis. 2. No other CT findings of the abdomen or pelvis to explain abdominal pain or lack of appetite. Electronically Signed   By: Jearld Lesch M.D.   On: 11/28/2021 16:26    Procedures Procedures    Medications Ordered in ED Medications  iohexol (OMNIPAQUE) 350 MG/ML injection 60 mL (60 mLs Intravenous Contrast Given 11/28/21 1600)    ED Course/ Medical Decision Making/ A&P                           Medical Decision Making Amount and/or Complexity of Data Reviewed Labs: ordered.   Lab work-up significant for  anemia.  I-STAT beta-hCG negative complete metabolic panel creatinine up some at 1.43 GFR 42.  Nothing for comparison aspirin Tylenol levels negative alcohol level negative CBC hemoglobin 7.7 it was normal in early 2023.  So this is new.  Patient denies any blood in her bowel movements.  Patient had CT scan abdomen ordered prior to me seeing her decompressing her bladder with mucosal thickening hyperenhancement suggesting infectious or inflammatory cystitis.  No other CT findings of the abdomen or pelvis to explain.  Urine drug screen negative.  Urinalysis is pending.  Urine was never ordered.  Patient cleared from a behavioral health standpoint.  Patient will be given referral to wellness clinic for follow-up.  Patient does not want to provide a urine sample.  Says that she has not really had any urinary tract symptoms.  She will follow-up with the wellness clinic.   Final Clinical Impression(s) / ED Diagnoses Final diagnoses:  Anemia, unspecified type    Rx / DC Orders ED Discharge Orders     None         Vanetta Mulders, MD 11/28/21 2025

## 2021-11-28 NOTE — ED Notes (Signed)
IV attempt unsuccessful x1

## 2021-12-21 ENCOUNTER — Emergency Department (HOSPITAL_COMMUNITY)
Admission: EM | Admit: 2021-12-21 | Discharge: 2021-12-22 | Disposition: A | Discharge status: Home / Self Care | Payer: Self-pay | Attending: Emergency Medicine | Admitting: Emergency Medicine

## 2021-12-21 ENCOUNTER — Emergency Department (HOSPITAL_COMMUNITY): Payer: Self-pay

## 2021-12-21 ENCOUNTER — Encounter (HOSPITAL_COMMUNITY): Payer: Self-pay | Admitting: Emergency Medicine

## 2021-12-21 DIAGNOSIS — R531 Weakness: Secondary | ICD-10-CM | POA: Insufficient documentation

## 2021-12-21 DIAGNOSIS — N3000 Acute cystitis without hematuria: Secondary | ICD-10-CM | POA: Insufficient documentation

## 2021-12-21 DIAGNOSIS — Z79899 Other long term (current) drug therapy: Secondary | ICD-10-CM | POA: Insufficient documentation

## 2021-12-21 DIAGNOSIS — R55 Syncope and collapse: Secondary | ICD-10-CM | POA: Insufficient documentation

## 2021-12-21 LAB — CBC WITH DIFFERENTIAL/PLATELET
Abs Immature Granulocytes: 0 10*3/uL (ref 0.00–0.07)
Basophils Absolute: 0 10*3/uL (ref 0.0–0.1)
Basophils Relative: 0 %
Eosinophils Absolute: 0.3 10*3/uL (ref 0.0–0.5)
Eosinophils Relative: 5 %
HCT: 21.3 % — ABNORMAL LOW (ref 36.0–46.0)
Hemoglobin: 7.7 g/dL — ABNORMAL LOW (ref 12.0–15.0)
Lymphocytes Relative: 21 %
Lymphs Abs: 1.3 10*3/uL (ref 0.7–4.0)
MCH: 45.6 pg — ABNORMAL HIGH (ref 26.0–34.0)
MCHC: 36.2 g/dL — ABNORMAL HIGH (ref 30.0–36.0)
MCV: 126 fL — ABNORMAL HIGH (ref 80.0–100.0)
Monocytes Absolute: 0.1 10*3/uL (ref 0.1–1.0)
Monocytes Relative: 2 %
Neutro Abs: 4.5 10*3/uL (ref 1.7–7.7)
Neutrophils Relative %: 72 %
Platelets: 179 10*3/uL (ref 150–400)
RBC: 1.69 MIL/uL — ABNORMAL LOW (ref 3.87–5.11)
RDW: 12.6 % (ref 11.5–15.5)
WBC: 6.2 10*3/uL (ref 4.0–10.5)
nRBC: 0 % (ref 0.0–0.2)
nRBC: 0 /100 WBC

## 2021-12-21 LAB — BASIC METABOLIC PANEL
Anion gap: 12 (ref 5–15)
BUN: 18 mg/dL (ref 6–20)
CO2: 21 mmol/L — ABNORMAL LOW (ref 22–32)
Calcium: 9.4 mg/dL (ref 8.9–10.3)
Chloride: 104 mmol/L (ref 98–111)
Creatinine, Ser: 1.42 mg/dL — ABNORMAL HIGH (ref 0.44–1.00)
GFR, Estimated: 43 mL/min — ABNORMAL LOW (ref >60)
Glucose, Bld: 99 mg/dL (ref 70–99)
Potassium: 3.8 mmol/L (ref 3.5–5.1)
Sodium: 137 mmol/L (ref 135–145)

## 2021-12-21 LAB — TROPONIN I (HIGH SENSITIVITY): Troponin I (High Sensitivity): 3 ng/L (ref <18)

## 2021-12-21 NOTE — ED Triage Notes (Signed)
Patient BIB [facility] from home after walking to a store and had a syncopal episode in the store at approximately 1700, after which she walked to her cousin's workplace who drove her home. Patient also complains of headache, chest pain, and tingling in her lips. Patient is alert, oriented, and in no apparent distress at this time.

## 2021-12-21 NOTE — ED Provider Triage Note (Signed)
Emergency Medicine Provider Triage Evaluation Note  Angelica Frank , a 61 y.o. female  was evaluated in triage.  Pt complains of syncopal episode while walking to the store.  Patient states she felt dizzy and lightheaded and woke up on the ground.  No history of seizures.  No urinary incontinence or mouth trauma.  Patient admits to left-sided chest pain..  Review of Systems  Positive: CP, syncope Negative: fever  Physical Exam  BP 111/61 (BP Location: Right Arm)   Pulse 63   Temp 98.3 F (36.8 C) (Oral)   Resp 16   SpO2 100%  Gen:   Awake, no distress   Resp:  Normal effort  MSK:   Moves extremities without difficulty  Other:    Medical Decision Making  Medically screening exam initiated at 7:11 PM.  Appropriate orders placed.  Angelica Frank was informed that the remainder of the evaluation will be completed by another provider, this initial triage assessment does not replace that evaluation, and the importance of remaining in the ED until their evaluation is complete.  Labs CT head   Angelica Frank 12/21/21 1912

## 2021-12-22 LAB — URINALYSIS, ROUTINE W REFLEX MICROSCOPIC
Bilirubin Urine: NEGATIVE
Glucose, UA: NEGATIVE mg/dL
Hgb urine dipstick: NEGATIVE
Ketones, ur: 5 mg/dL — AB
Nitrite: NEGATIVE
Protein, ur: 100 mg/dL — AB
Specific Gravity, Urine: 1.019 (ref 1.005–1.030)
WBC, UA: >50 WBC/hpf — ABNORMAL HIGH (ref 0–5)
pH: 7 (ref 5.0–8.0)

## 2021-12-22 LAB — RAPID URINE DRUG SCREEN, HOSP PERFORMED
Amphetamines: NOT DETECTED
Barbiturates: NOT DETECTED
Benzodiazepines: NOT DETECTED
Cocaine: NOT DETECTED
Opiates: NOT DETECTED
Tetrahydrocannabinol: NOT DETECTED

## 2021-12-22 LAB — HEPATIC FUNCTION PANEL
ALT: 15 U/L (ref 0–44)
AST: 29 U/L (ref 15–41)
Albumin: 4.4 g/dL (ref 3.5–5.0)
Alkaline Phosphatase: 52 U/L (ref 38–126)
Bilirubin, Direct: 0.3 mg/dL — ABNORMAL HIGH (ref 0.0–0.2)
Indirect Bilirubin: 1.7 mg/dL — ABNORMAL HIGH (ref 0.3–0.9)
Total Bilirubin: 2 mg/dL — ABNORMAL HIGH (ref 0.3–1.2)
Total Protein: 7.6 g/dL (ref 6.5–8.1)

## 2021-12-22 LAB — D-DIMER, QUANTITATIVE: D-Dimer, Quant: 0.52 ug/mL-FEU — ABNORMAL HIGH (ref 0.00–0.50)

## 2021-12-22 LAB — TROPONIN I (HIGH SENSITIVITY): Troponin I (High Sensitivity): 2 ng/L (ref <18)

## 2021-12-22 MED ORDER — LACTATED RINGERS IV BOLUS
1000.0000 mL | Freq: Once | INTRAVENOUS | Status: AC
  Administered 2021-12-22: 1000 mL via INTRAVENOUS

## 2021-12-22 MED ORDER — LACTATED RINGERS IV BOLUS
500.0000 mL | Freq: Once | INTRAVENOUS | Status: AC
  Administered 2021-12-22: 500 mL via INTRAVENOUS

## 2021-12-22 MED ORDER — SODIUM CHLORIDE 0.9 % IV SOLN
1.0000 g | Freq: Once | INTRAVENOUS | Status: AC
  Administered 2021-12-22: 1 g via INTRAVENOUS
  Filled 2021-12-22: qty 10

## 2021-12-22 MED ORDER — CEPHALEXIN 500 MG PO CAPS: 500.0000 mg | ORAL_CAPSULE | Freq: Two times a day (BID) | ORAL | 0 refills | Status: AC

## 2021-12-22 NOTE — ED Provider Notes (Signed)
[facility] EMERGENCY DEPARTMENT Provider Note   CSN: [id number] Arrival date & time: 12/21/21  1845     History  No chief complaint on file.   Angelica Frank is a 61 y.o. female.  HPI 61 year old female who presents to the ER with a syncopal episode.  Patient states that she was walking outside of a convenience store when she lost consciousness for an unknown amount of time.  She found herself on the ground when she woke up and ended up walking to her cousin's convenience store when he took her to the emergency room.  She endorses some left-sided chest discomfort and continues to feel weak and like she may pass out.  She endorses adequate water intake but has not been eating much.  She states she has had a history of this in the past but did not know what the cause of this was.  She has not had any syncopal episodes recently.  She denies any chest pain or shortness of breath surrounding the syncopal episode but did state that she felt some tingling around her lips.  She denies any fevers or chills.  No abdominal pain.  Denies any recent drug or alcohol use.  Denies any bright red blood in her stools or melena    Home Medications Prior to Admission medications   Medication Sig Start Date End Date Taking? Authorizing Provider  cephALEXin (KEFLEX) 500 MG capsule Take 1 capsule (500 mg total) by mouth 2 (two) times daily for 7 days. 12/22/21 12/29/21 Yes Mare Ferrari, PA-C  acetaminophen (TYLENOL) 500 MG tablet Take 500-1,000 mg by mouth every 6 (six) hours as needed. For headache/pain    [provider]  albuterol (PROVENTIL HFA;VENTOLIN HFA) 108 (90 Base) MCG/ACT inhaler Inhale 1-2 puffs into the lungs every 6 (six) hours as needed for wheezing or shortness of breath. Patient not taking: Reported on 07/27/2020 07/10/17   Terrilee Files, MD  albuterol (VENTOLIN HFA) 108 (90 Base) MCG/ACT inhaler Inhale 2 puffs into the lungs every 4 (four) hours as needed for  wheezing or shortness of breath. 04/22/21   Cathren Laine, MD  ibuprofen (ADVIL) 200 MG tablet Take 200 mg by mouth every 6 (six) hours as needed for headache or moderate pain.    [provider]  methocarbamol (ROBAXIN) 500 MG tablet Take 1 tablet (500 mg total) by mouth 2 (two) times daily. 05/07/21   Garlon Hatchet, PA-C  oxyCODONE-acetaminophen (PERCOCET) 5-325 MG tablet Take 1 tablet by mouth every 4 (four) hours as needed. 05/07/21   Garlon Hatchet, PA-C  pantoprazole (PROTONIX) 20 MG tablet Take 2 tablets (40 mg total) by mouth daily for 7 days. 07/27/20 08/03/20  Alvira Monday, MD  pantoprazole (PROTONIX) 40 MG tablet Take 1 tablet (40 mg total) by mouth daily. 04/22/21   Cathren Laine, MD  predniSONE (DELTASONE) 20 MG tablet Take 3 tablets (60 mg total) by mouth daily. Patient not taking: Reported on 07/27/2020 07/10/17   Terrilee Files, MD      Allergies    Penicillins    Review of Systems   Review of Systems  Physical Exam Updated Vital Signs BP (!) 116/53   Pulse 62   Temp 98.1 F (36.7 C) (Oral)   Resp 18   Ht 5\' 8"  (1.727 m)   Wt 77 kg   SpO2 100%   BMI 25.81 kg/m  Physical Exam Vitals and nursing note reviewed.  Constitutional:      General:  She is not in acute distress.    Appearance: She is well-developed.  HENT:     Head: Normocephalic and atraumatic.  Eyes:     Conjunctiva/sclera: Conjunctivae normal.  Cardiovascular:     Rate and Rhythm: Normal rate and regular rhythm.     Heart sounds: No murmur heard. Pulmonary:     Effort: Pulmonary effort is normal. No respiratory distress.     Breath sounds: Normal breath sounds.  Abdominal:     Palpations: Abdomen is soft.     Tenderness: There is no abdominal tenderness.  Musculoskeletal:        General: No swelling.     Cervical back: Neck supple.  Skin:    General: Skin is warm and dry.     Capillary Refill: Capillary refill takes less than 2 seconds.  Neurological:     General: No focal  deficit present.     Mental Status: She is alert and oriented to person, place, and time.     Comments: Mental Status:  Alert, thought content appropriate, able to give a coherent history. Speech fluent without evidence of aphasia. Able to follow 2 step commands without difficulty.  Cranial Nerves:  II:  Peripheral visual fields grossly normal, pupils equal, round, reactive to light III,IV, VI: ptosis not present, extra-ocular motions intact bilaterally  V,VII: smile symmetric, facial light touch sensation equal VIII: hearing grossly normal to voice  X: uvula elevates symmetrically  XI: bilateral shoulder shrug symmetric and strong XII: midline tongue extension without fassiculations Motor:  Normal tone. 5/5 strength of BUE and BLE major muscle groups including strong and equal grip strength and dorsiflexion/plantar flexion Sensory: light touch normal in all extremities. Cerebellar: normal finger-to-nose with bilateral upper extremities, Romberg sign absent Gait: not accessed    Psychiatric:        Mood and Affect: Mood normal.     ED Results / Procedures / Treatments   Labs (all labs ordered are listed, but only abnormal results are displayed) Labs Reviewed  CBC WITH DIFFERENTIAL/PLATELET - Abnormal; Notable for the following components:      Result Value   RBC 1.69 (*)    Hemoglobin 7.7 (*)    HCT 21.3 (*)    MCV 126.0 (*)    MCH 45.6 (*)    MCHC 36.2 (*)    All other components within normal limits  BASIC METABOLIC PANEL - Abnormal; Notable for the following components:   CO2 21 (*)    Creatinine, Ser 1.42 (*)    GFR, Estimated 43 (*)    All other components within normal limits  URINALYSIS, ROUTINE W REFLEX MICROSCOPIC - Abnormal; Notable for the following components:   APPearance HAZY (*)    Ketones, ur 5 (*)    Protein, ur 100 (*)    Leukocytes,Ua MODERATE (*)    WBC, UA >50 (*)    Bacteria, UA FEW (*)    All other components within normal limits  D-DIMER,  QUANTITATIVE - Abnormal; Notable for the following components:   D-Dimer, Quant 0.52 (*)    All other components within normal limits  HEPATIC FUNCTION PANEL - Abnormal; Notable for the following components:   Total Bilirubin 2.0 (*)    Bilirubin, Direct 0.3 (*)    Indirect Bilirubin 1.7 (*)    All other components within normal limits  URINE CULTURE  RAPID URINE DRUG SCREEN, HOSP PERFORMED  TROPONIN I (HIGH SENSITIVITY)  TROPONIN I (HIGH SENSITIVITY)    EKG EKG Interpretation  Date/Time:  Tuesday December 21 2021 18:48:57 EDT Ventricular Rate:  60 PR Interval:  142 QRS Duration: 70 QT Interval:  446 QTC Calculation: 446 R Axis:   0 Text Interpretation: Normal sinus rhythm Confirmed by Nicanor Alcon, April ([id number]) on 12/22/2021 8:57:05 AM  Radiology CT Head Wo Contrast  Result Date: 12/21/2021 CLINICAL DATA:  Syncope/presyncope felt dizzy and lightheaded and woke up on the ground. EXAM: CT HEAD WITHOUT CONTRAST TECHNIQUE: Contiguous axial images were obtained from the base of the skull through the vertex without intravenous contrast. RADIATION DOSE REDUCTION: This exam was performed according to the departmental dose-optimization program which includes automated exposure control, adjustment of the mA and/or kV according to patient size and/or use of iterative reconstruction technique. COMPARISON:  CT examination dated February 24, 2014 FINDINGS: Brain: No evidence of acute infarction, hemorrhage, hydrocephalus, extra-axial collection or mass lesion/mass effect. Vascular: No hyperdense vessel or unexpected calcification. Skull: Normal. Negative for fracture or focal lesion. Sinuses/Orbits: Partial opacification of the right maxillary sinus. Other: None. IMPRESSION: No acute intracranial pathology. Mild paranasal sinus disease. Electronically Signed   By: Larose Hires D.O.   On: 12/21/2021 21:40   DG Chest 1 View  Result Date: 12/21/2021 CLINICAL DATA:  Chest pain. EXAM: CHEST  1 VIEW  COMPARISON:  Chest x-ray May 06, 2021. FINDINGS: The heart size and mediastinal contours are within normal limits. Both lungs are clear. No visible pleural effusions or pneumothorax. No acute osseous abnormality. IMPRESSION: No active disease. Electronically Signed   By: Feliberto Harts M.D.   On: 12/21/2021 19:33    Procedures Procedures    Medications Ordered in ED Medications  lactated ringers bolus 1,000 mL (0 mLs Intravenous Stopped 12/22/21 1138)  cefTRIAXone (ROCEPHIN) 1 g in sodium chloride 0.9 % 100 mL IVPB (0 g Intravenous Stopped 12/22/21 1350)  lactated ringers bolus 500 mL (500 mLs Intravenous New Bag/Given 12/22/21 1348)    ED Course/ Medical Decision Making/ A&P                           Medical Decision Making Amount and/or Complexity of Data Reviewed Labs: ordered.   INITIAL IMPRESSION / ASSESSMENT AND PLAN / ED COURSE   Pertinent labs & imaging results that were available during my care of the patient were reviewed by me and considered in my medical decision making (see chart for details).   This patient is Presenting for Evaluation of sycope which does require a range of treatment options, and is a complaint that involves a high risk of morbidity and mortality.   The Differential Diagnoses includes but is not exclusive to arrythmia (Vtach, SVT, SSS, sinus arrest, AV block, bradycardia) aortic stenosis, AMI, HOCM, PE, atrial myxoma, pulmonary hypertension, orthostatic hypotension, (hypovolemia, drug effect, GB syndrome, micturition, cough, swall) carotid sinus sensitivity, Seizure, TIA/CVA, hypoglycemia,  Vertigo.    I decided to review pertinent External Data, and in summary from prior visits   Clinical Laboratory Tests ordered, reviewed, and interpreted by me   CBC without leukocytosis, hemoglobin of 7.7 but this appears to be stable from 3 weeks ago.  Patient denies any signs of acute bleeding.  Her BMP shows no significant electrode abnormalities, her  creatinine appears to be at baseline at 1.42.  Her UA shows moderate leukocytes, more than 50 WBCs few bacteria.  Her delta troponin is negative.  Her hepatic function panel shows a normal albumin, slightly elevated T. bili and direct bilirubin and indirect bilirubin, however  patient denies any abdominal pain.  UDS negative.  D-dimer is elevated at 0.52 however negative for age-adjusted values.   Radiologic Tests Ordered,  I independently interpreted the images and agree with radiology interpretation.   CT of the head negative for acute intracranial abnormality X-ray negative for pneumonia, pleural effusion   Cardiac Monitor Tracing which shows normal sinus rhythm with a heart rate of 60   Social Determinants of Health Risk patient is uninsured with no established PCP      Medical Decision Making: Summary:   Presenting with syncopal episodes, weakness, dizziness.  Her vitals on arrival were overall reassuring.  Her heart rate appears to be at baseline.  Not hypoxic, tachycardic or tachypneic.  She has no focal neurologic deficits on exam.  Her UA is suggestive of a UTI, her hemoglobin is 7.7 but this does not appear to be acute.  She had an elevated bilirubin of 2 and a direct bilirubin of 0.3 and indirect of 1.7, however she denies any abdominal pain and she is not tender in the right upper quadrant.  Her LFTs are normal.  She did endorse that she was drinking water but not eating very much lately.  She was given a liter of fluids, and stick ambulated the patient and she continues to feel weak and dizzy.  She was given an additional half a liter of fluids as well as Rocephin.  I did consider obtaining MRI imaging and possible CTA of the head however on my reevaluation, patient is she feels significantly better and was ambulated without difficulty.  She would like to go home.  Will discharge her with Keflex, and will refer her to [facility] community health and wellness as she is uninsured.  I did give her  strict return precautions to return if she continues to have any issues.  I have low suspicion for PE at this time, dissection, acute abdomen.  Suspect likely hypovolemic (although her orthostatic vitals were negative) and possibly weakness secondary due to UTI. Urine sent for culture.  Low suspicion for acute stroke or mass at this time.  No evidence of arrhythmia, low suspicion for ACS.  Hemoglobin 7.7 but no signs of active bleed and this is chronic.  Discussed with Dr. Durwin Nora who is agreeable to the above plan and disposition Reevaluation with update and discussion with discussion of the patient's findings, will treat for UTI and refer her to [facility] as she is uninsured.  She is requesting to go home and I have low suspicion for acute life-threatening emergency.  Considered admission however no indication based on work-up Disposition: Discharged with antibiotics, referral to [facility] community health and wellness and strict return precautions  Final Clinical Impression(s) / ED Diagnoses Final diagnoses:  Acute cystitis without hematuria  Weakness  Syncope, unspecified syncope type    Rx / DC Orders ED Discharge Orders          Ordered    cephALEXin (KEFLEX) 500 MG capsule  2 times daily        12/22/21 1509              Leone Brand 12/22/21 1509    Gloris Manchester, MD 12/25/21 2127

## 2021-12-22 NOTE — Discharge Instructions (Addendum)
You were evaluated in the Emergency Department and after careful evaluation, we did not find any emergent condition requiring admission or further testing in the hospital.  Your work-up today was overall reassuring, I suspect her symptoms may be due to UTI and not drinking or eating enough.  Please take the antibiotics as directed until finished.  Your urine was sent for culture and if we need to change your antibiotics our pharmacy will reach out to you to change this.  Your hemoglobin was low but this appears to be chronic.  Please call the phone number for clinic community health and wellness as this is a free clinic in the area to establish care and follow-up on your visit today.  Please return to the Emergency Department if you experience any worsening of your condition. Thank you for allowing [place] to be a part of your care.

## 2021-12-25 LAB — URINE CULTURE: Culture: >=100000 — AB

## 2021-12-26 ENCOUNTER — Telehealth (HOSPITAL_BASED_OUTPATIENT_CLINIC_OR_DEPARTMENT_OTHER): Payer: Self-pay | Admitting: *Deleted

## 2021-12-26 NOTE — Telephone Encounter (Signed)
Post ED Visit - Positive Culture Follow-up  Culture report reviewed by antimicrobial stewardship pharmacist: [place] [facility] []  Elenor Quinones, Pharm.D. []  Heide Guile, Pharm.D., BCPS AQ-ID []  Parks Neptune, Pharm.D., BCPS []  Alycia Rossetti, Pharm.D., BCPS []  [place], Pharm.D., BCPS, AAHIVP []  Legrand Como, Pharm.D., BCPS, AAHIVP []  Salome Arnt, PharmD, BCPS []  Johnnette Gourd, PharmD, BCPS []  Hughes Better, PharmD, BCPS []  Leeroy Cha, PharmD []  Laqueta Linden, PharmD, BCPS [x]  Bertis Ruddy, PharmD  [place] Team []  Leodis Sias, PharmD []  Lindell Spar, PharmD []  Royetta Asal, PharmD []  Graylin Shiver, Rph []  Rema Fendt) Glennon Mac, PharmD []  Arlyn Dunning, PharmD []  Netta Cedars, PharmD []  Dia Sitter, PharmD []  Leone Haven, PharmD []  Gretta Arab, PharmD []  Theodis Shove, PharmD []  Peggyann Juba, PharmD []  Reuel Boom, PharmD   Positive urine culture Treated with Cephalexin, organism sensitive to the same and no further patient follow-up is required at this time.  Rosie Fate 12/26/2021, 11:51 AM

## 2022-03-09 ENCOUNTER — Other Ambulatory Visit: Payer: Self-pay

## 2022-03-09 ENCOUNTER — Emergency Department (HOSPITAL_COMMUNITY)
Admission: EM | Admit: 2022-03-09 | Discharge: 2022-03-09 | Disposition: A | Discharge status: Home / Self Care | Payer: Medicaid Other | Attending: Emergency Medicine | Admitting: Emergency Medicine

## 2022-03-09 ENCOUNTER — Emergency Department (HOSPITAL_COMMUNITY): Payer: Medicaid Other

## 2022-03-09 ENCOUNTER — Encounter (HOSPITAL_COMMUNITY): Payer: Self-pay

## 2022-03-09 DIAGNOSIS — R059 Cough, unspecified: Secondary | ICD-10-CM | POA: Diagnosis present

## 2022-03-09 DIAGNOSIS — J40 Bronchitis, not specified as acute or chronic: Secondary | ICD-10-CM | POA: Insufficient documentation

## 2022-03-09 DIAGNOSIS — Z1152 Encounter for screening for COVID-19: Secondary | ICD-10-CM | POA: Insufficient documentation

## 2022-03-09 DIAGNOSIS — I10 Essential (primary) hypertension: Secondary | ICD-10-CM | POA: Insufficient documentation

## 2022-03-09 DIAGNOSIS — F17210 Nicotine dependence, cigarettes, uncomplicated: Secondary | ICD-10-CM | POA: Insufficient documentation

## 2022-03-09 LAB — RESP PANEL BY RT-PCR (RSV, FLU A&B, COVID)  RVPGX2
Influenza A by PCR: NEGATIVE
Influenza B by PCR: NEGATIVE
Resp Syncytial Virus by PCR: NEGATIVE
SARS Coronavirus 2 by RT PCR: NEGATIVE

## 2022-03-09 MED ORDER — PROMETHAZINE-DM 6.25-15 MG/5ML PO SYRP: 5.0000 mL | ORAL_SOLUTION | Freq: Four times a day (QID) | ORAL | 0 refills | Status: AC | PRN

## 2022-03-09 MED ORDER — PREDNISONE 20 MG PO TABS: 40.0000 mg | ORAL_TABLET | Freq: Every day | ORAL | 0 refills | Status: AC

## 2022-03-09 MED ORDER — ALBUTEROL SULFATE HFA 108 (90 BASE) MCG/ACT IN AERS: 2.0000 | INHALATION_SPRAY | RESPIRATORY_TRACT | 1 refills | Status: AC | PRN

## 2022-03-09 NOTE — ED Provider Notes (Signed)
[facility] EMERGENCY DEPARTMENT Provider Note   CSN: [id number] Arrival date & time: 03/09/22  1347     History  Chief Complaint  Patient presents with   Cough    Angelica Frank is a 62 y.o. female.  HPI Presents with concern of cough and congestion.  Onset was 2 weeks ago.  She continues to smoke cigarettes.  No fever, vomiting.  No relief with OTC medication.    Home Medications Prior to Admission medications   Medication Sig Start Date End Date Taking? Authorizing Provider  promethazine-dextromethorphan (PROMETHAZINE-DM) 6.25-15 MG/5ML syrup Take 5 mLs by mouth 4 (four) times daily as needed for cough. 03/09/22  Yes Carmin Muskrat, MD  acetaminophen (TYLENOL) 500 MG tablet Take 500-1,000 mg by mouth every 6 (six) hours as needed. For headache/pain    [provider]  albuterol (PROVENTIL HFA;VENTOLIN HFA) 108 (90 Base) MCG/ACT inhaler Inhale 1-2 puffs into the lungs every 6 (six) hours as needed for wheezing or shortness of breath. Patient not taking: Reported on 07/27/2020 07/10/17   Hayden Rasmussen, MD  albuterol (VENTOLIN HFA) 108 (90 Base) MCG/ACT inhaler Inhale 2 puffs into the lungs every 4 (four) hours as needed for wheezing or shortness of breath. 03/09/22   Carmin Muskrat, MD  ibuprofen (ADVIL) 200 MG tablet Take 200 mg by mouth every 6 (six) hours as needed for headache or moderate pain.    [provider]  methocarbamol (ROBAXIN) 500 MG tablet Take 1 tablet (500 mg total) by mouth 2 (two) times daily. 05/07/21   Larene Pickett, PA-C  oxyCODONE-acetaminophen (PERCOCET) 5-325 MG tablet Take 1 tablet by mouth every 4 (four) hours as needed. 05/07/21   Larene Pickett, PA-C  pantoprazole (PROTONIX) 20 MG tablet Take 2 tablets (40 mg total) by mouth daily for 7 days. 07/27/20 08/03/20  Gareth Morgan, MD  pantoprazole (PROTONIX) 40 MG tablet Take 1 tablet (40 mg total) by mouth daily. 04/22/21   Lajean Saver, MD  predniSONE (DELTASONE) 20  MG tablet Take 2 tablets (40 mg total) by mouth daily for 5 days. 03/09/22 03/14/22  Carmin Muskrat, MD      Allergies    Penicillins    Review of Systems   Review of Systems  All other systems reviewed and are negative.   Physical Exam Updated Vital Signs BP 118/76 (BP Location: Left Arm)   Pulse 66   Temp 98.6 F (37 C) (Oral)   Resp 20   Ht 5\' 8"  (1.727 m)   Wt 77 kg   SpO2 100%   BMI 25.81 kg/m  Physical Exam Vitals and nursing note reviewed.  Constitutional:      General: She is not in acute distress.    Appearance: She is well-developed.  HENT:     Head: Normocephalic and atraumatic.  Eyes:     Conjunctiva/sclera: Conjunctivae normal.  Cardiovascular:     Rate and Rhythm: Normal rate and regular rhythm.  Pulmonary:     Effort: Pulmonary effort is normal. No respiratory distress.     Breath sounds: Normal breath sounds. No stridor. No wheezing, rhonchi or rales.  Abdominal:     General: There is no distension.  Skin:    General: Skin is warm and dry.  Neurological:     Mental Status: She is alert and oriented to person, place, and time.     Cranial Nerves: No cranial nerve deficit.  Psychiatric:        Mood and Affect: Mood  normal.     ED Results / Procedures / Treatments   Labs (all labs ordered are listed, but only abnormal results are displayed) Labs Reviewed  RESP PANEL BY RT-PCR (RSV, FLU A&B, COVID)  [id number]    EKG EKG Interpretation  Date/Time:  Wednesday March 09 2022 15:31:05 EST Ventricular Rate:  72 PR Interval:  144 QRS Duration: 76 QT Interval:  432 QTC Calculation: 473 R Axis:   -29 Text Interpretation: Normal sinus rhythm Nonspecific T wave abnormality Artifact Abnormal ECG Confirmed by Carmin Muskrat [phone]) on 03/09/2022 6:17:43 PM  Radiology DG Chest 1 View  Result Date: 03/09/2022 CLINICAL DATA:  Chest pain.  Chest congestion. EXAM: CHEST  1 VIEW COMPARISON:  12/01/2021 FINDINGS: The heart size and mediastinal contours  are within normal limits. Both lungs are clear. The visualized skeletal structures are unremarkable. IMPRESSION: No active disease. Electronically Signed   By: Van Clines M.D.   On: 03/09/2022 16:59    Procedures Procedures    Medications Ordered in ED Medications - No data to display  ED Course/ Medical Decision Making/ A&P                           Medical Decision Making Smoker with a history of hypertension, prior episodes of bronchitis and pneumonia presents with cough, congestion for 2 weeks.  Differential including pneumonia, bronchitis, COVID, influenza.   Amount and/or Complexity of Data Reviewed External Data Reviewed: notes. Labs:  Decision-making details documented in ED Course. Radiology:  Decision-making details documented in ED Course. ECG/medicine tests:  Decision-making details documented in ED Course.  Risk Prescription drug management.   6:39 PM Patient in no distress, awake, alert, sitting upright, speaking clearly.  No oxygen requirement, no hemodynamic instability.  ECG without arrhythmia, ischemia.  Labs reviewed, unremarkable, no evidence for bacteremia, sepsis.  X-ray reviewed, no evidence for pneumonia. Patient discharged with ongoing steroids, bronchodilators and decongestants.        Final Clinical Impression(s) / ED Diagnoses Final diagnoses:  Bronchitis    Rx / DC Orders ED Discharge Orders          Ordered    predniSONE (DELTASONE) 20 MG tablet  Daily        03/09/22 1837    albuterol (VENTOLIN HFA) 108 (90 Base) MCG/ACT inhaler  Every 4 hours PRN        03/09/22 1837    promethazine-dextromethorphan (PROMETHAZINE-DM) 6.25-15 MG/5ML syrup  4 times daily PRN        03/09/22 1837              Carmin Muskrat, MD 03/09/22 1840

## 2022-03-09 NOTE — ED Provider Triage Note (Signed)
Emergency Medicine Provider Triage Evaluation Note  Angelica Frank , a 62 y.o. female  was evaluated in triage.  Pt complains of cough and chest congestion x 2 weeks.  No fever or chills.  Admits to some chest pain.  Review of Systems  Positive: cough Negative: fever  Physical Exam  BP (!) 106/53   Pulse 86   Temp 98.7 F (37.1 C)   Resp 16   SpO2 95%  Gen:   Awake, no distress   Resp:  Normal effort  MSK:   Moves extremities without difficulty  Other:    Medical Decision Making  Medically screening exam initiated at 3:12 PM.  Appropriate orders placed.  Angelica Frank was informed that the remainder of the evaluation will be completed by another provider, this initial triage assessment does not replace that evaluation, and the importance of remaining in the ED until their evaluation is complete.  RVP CXR/EKG   Suzy Bouchard, [place] 03/09/22 1513

## 2022-03-09 NOTE — ED Triage Notes (Signed)
Pt with cough and congestion x 2 weeks.  Feeling "hot"  Has not had covid or flu test.

## 2022-03-09 NOTE — Discharge Instructions (Signed)
As discussed, your cough may be due to bronchitis.  This occurs not infrequently in people who smoke, even if it is not a regular habit.  Please take all medication as directed, use the provided albuterol for additional relief, and follow-up with our community care center.
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
